# Patient Record
Sex: Female | Born: 1955
Health system: Southern US, Community
[De-identification: ages and names within clinical notes are randomized; demographics above are authoritative.]

## PROBLEM LIST (undated history)

## (undated) DIAGNOSIS — T8859XA Other complications of anesthesia, initial encounter: Secondary | ICD-10-CM

## (undated) DIAGNOSIS — G8929 Other chronic pain: Secondary | ICD-10-CM

## (undated) DIAGNOSIS — Z9889 Other specified postprocedural states: Secondary | ICD-10-CM

## (undated) DIAGNOSIS — M1990 Unspecified osteoarthritis, unspecified site: Secondary | ICD-10-CM

## (undated) DIAGNOSIS — T4145XA Adverse effect of unspecified anesthetic, initial encounter: Secondary | ICD-10-CM

## (undated) DIAGNOSIS — R112 Nausea with vomiting, unspecified: Secondary | ICD-10-CM

## (undated) DIAGNOSIS — I10 Essential (primary) hypertension: Secondary | ICD-10-CM

## (undated) DIAGNOSIS — L4050 Arthropathic psoriasis, unspecified: Secondary | ICD-10-CM

## (undated) HISTORY — PX: BACK SURGERY: SHX140

## (undated) HISTORY — PX: ABDOMINAL HYSTERECTOMY: SHX81

---
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
## 1998-01-19 ENCOUNTER — Encounter: Payer: Self-pay | Admitting: Specialist

## 1998-01-19 ENCOUNTER — Ambulatory Visit (HOSPITAL_COMMUNITY): Admission: RE | Admit: 1998-01-19 | Discharge: 1998-01-19 | Payer: Self-pay | Admitting: Specialist

## 1999-01-18 ENCOUNTER — Other Ambulatory Visit: Admission: RE | Admit: 1999-01-18 | Discharge: 1999-01-18 | Payer: Self-pay | Admitting: Obstetrics and Gynecology

## 1999-08-11 ENCOUNTER — Inpatient Hospital Stay (HOSPITAL_COMMUNITY): Admission: RE | Admit: 1999-08-11 | Discharge: 1999-08-14 | Payer: Self-pay | Admitting: Obstetrics and Gynecology

## 1999-08-11 ENCOUNTER — Encounter (INDEPENDENT_AMBULATORY_CARE_PROVIDER_SITE_OTHER): Payer: Self-pay | Admitting: Specialist

## 2001-01-03 ENCOUNTER — Encounter: Payer: Self-pay | Admitting: Specialist

## 2001-01-09 ENCOUNTER — Inpatient Hospital Stay (HOSPITAL_COMMUNITY): Admission: RE | Admit: 2001-01-09 | Discharge: 2001-01-11 | Payer: Self-pay | Admitting: Specialist

## 2001-01-09 ENCOUNTER — Encounter: Payer: Self-pay | Admitting: Specialist

## 2001-12-31 ENCOUNTER — Other Ambulatory Visit: Admission: RE | Admit: 2001-12-31 | Discharge: 2001-12-31 | Payer: Self-pay | Admitting: Obstetrics and Gynecology

## 2002-01-06 ENCOUNTER — Encounter: Admission: RE | Admit: 2002-01-06 | Discharge: 2002-01-06 | Payer: Self-pay | Admitting: Family Medicine

## 2002-01-06 ENCOUNTER — Encounter: Payer: Self-pay | Admitting: Family Medicine

## 2002-05-27 ENCOUNTER — Encounter: Payer: Self-pay | Admitting: Otolaryngology

## 2002-05-27 ENCOUNTER — Encounter: Admission: RE | Admit: 2002-05-27 | Discharge: 2002-05-27 | Payer: Self-pay | Admitting: Otolaryngology

## 2004-10-11 ENCOUNTER — Other Ambulatory Visit: Admission: RE | Admit: 2004-10-11 | Discharge: 2004-10-11 | Payer: Self-pay | Admitting: Obstetrics and Gynecology

## 2005-01-18 ENCOUNTER — Ambulatory Visit (HOSPITAL_COMMUNITY): Admission: RE | Admit: 2005-01-18 | Discharge: 2005-01-19 | Payer: Self-pay | Admitting: Specialist

## 2005-11-02 ENCOUNTER — Encounter: Admission: RE | Admit: 2005-11-02 | Discharge: 2005-11-02 | Payer: Self-pay | Admitting: Specialist

## 2006-02-05 ENCOUNTER — Inpatient Hospital Stay (HOSPITAL_COMMUNITY): Admission: RE | Admit: 2006-02-05 | Discharge: 2006-02-11 | Payer: Self-pay | Admitting: Specialist

## 2007-11-21 ENCOUNTER — Encounter: Admission: RE | Admit: 2007-11-21 | Discharge: 2007-11-21 | Payer: Self-pay | Admitting: Anesthesiology

## 2008-01-06 ENCOUNTER — Encounter: Admission: RE | Admit: 2008-01-06 | Discharge: 2008-01-06 | Payer: Self-pay | Admitting: Anesthesiology

## 2008-03-16 ENCOUNTER — Ambulatory Visit (HOSPITAL_COMMUNITY): Admission: RE | Admit: 2008-03-16 | Discharge: 2008-03-16 | Payer: Self-pay | Admitting: Orthopaedic Surgery

## 2008-07-08 ENCOUNTER — Ambulatory Visit: Payer: Self-pay | Admitting: Gastroenterology

## 2009-10-07 ENCOUNTER — Encounter: Admission: RE | Admit: 2009-10-07 | Discharge: 2009-10-07 | Payer: Self-pay | Admitting: Anesthesiology

## 2010-08-29 NOTE — Op Note (Signed)
NAMECLARA, HERBISON              ACCOUNT NO.:  [id number]   MEDICAL RECORD NO.:  [id number]          PATIENT TYPE:  AMB   LOCATION:  SDS                          FACILITY:  [facility]   PHYSICIAN:  Vanita Panda. Magnus Ivan, M.D.DATE OF BIRTH:  09/16/1955   DATE OF PROCEDURE:  03/16/2008  DATE OF DISCHARGE:  03/16/2008                               OPERATIVE REPORT   PREOPERATIVE DIAGNOSIS:  Left shoulder partial-thickness rotator cuff  tear and impingement.   POSTOPERATIVE DIAGNOSES:  Left shoulder partial-thickness rotator cuff  tear and impingement.   PROCEDURES:  1. Left shoulder arthroscopy with extensive debridement.  2. Left shoulder arthroscopic subacromial decompression.   SURGEON:  Vanita Panda. Magnus Ivan, MD   ANESTHESIA:  1. Left shoulder regional block.  2. General.   BLOOD LOSS:  Minimal.   COMPLICATIONS:  None.   INDICATIONS:  Briefly, Ms. [name] is a 55 year old female with  debilitating pain involving her left shoulder.  She is on chronic pain  medicine for back, but her shoulder certainly had been flaring up for  some time now.  She had failed conservative nonoperative treatment with  exercise and anti-inflammatories.  The shoulder MRI did show significant  tendinosis of the rotator cuff and the loose partial thickness tearing  and AC joint arthritis.  It was recommended, she undergo arthroscopic  surgery with subacromial decompression, debridement, and possible  rotator cuff repair depending on the extent of the tear.  The risks and  benefits of surgery were explained to her in length and she agrees to  proceed with surgery.   PROCEDURE DESCRIPTION:  After informed consent was obtained and  appropriate left shoulder was marked, anesthesia obtained from regional  shoulder block of the left shoulder.  She was then brought to the  operating room and placed supine on the operating table.  General  anesthesia was then obtained.  She was then fashioned to a  beach chair  position with appropriate positioning of the head and neck.  Appropriate  padding of the nonoperative right arm and positioning of the waist and  knees.  Her left shoulder was then prepped and draped with DuraPrep and  sterile drapes including sterile stockinette and was placed in an in-  line skeletal traction using the fishing pole traction device and 10  pounds of traction.  This was a 4-5 degrees of forward flexion and  neutral rotation.  Time-out was called.  She was identified as the  correct patient, correct left arm.  I then made a posterior incision  just off the posterior lateral edge of the acromion and the glenohumeral  joint was easily encountered.  You could see, there was abundant  synovitis within the shoulder.  The rotator cuff was probed and was  found to have pretty significant articular surface tearing.  This was  not a full-thickness tear.  I then made an anterior portal just lateral  to the coracoid process and a soft tissue ablation wand was inserted and  significant debridement was carried out of the glenohumeral joint.  The  biceps tendon and the labrum were intact posteriorly and anteriorly.  The subscapularis tendon was intact as well.  I did debride undersurface  of the rotator cuff.  I then entered the subacromial space to the  posterior portal.  I made a separate lateral portal, you could see there  was significant bursitis and tendinosis of the rotator cuff and  impingement from bone spurs of the acromion and the distal clavicle.  Arthroscopic soft tissue ablation wand was inserted in this area and  then carried out without significant debridement of the subacromial  space followed by a decompression through the anterior portal using a  high-speed bur to CLO plain underneath the clavicle and the acromion,  and removed the bone spur from the acromion.  I did put the shoulder  through range of motion and I glided appropriately.  I probed the   rotator cuff again and it was found to have its bursal surface fibers  intact.  I then removed all instrumentation and closed the portal sites  with interrupted 3-0 nylon suture.  Xeroform followed by sterile  dressing was applied.  The patient's arm was placed in a sling.  She was  awakened, extubated, and taken to recovery room in stable condition.  All final counts were correct and there were no complications noted.   Of note, postoperatively, I will have her on short-acting pain  medication including oxycodone.  We will have her on some Phenergan for  nausea.  She will continue her other chronic pain medicines as well.      Vanita Panda. Magnus Ivan, M.D.  Electronically Signed     CYB/MEDQ  D:  03/16/2008  T:  03/17/2008  Job:  [id number]   cc:   [facility]

## 2010-09-01 NOTE — H&P (Signed)
[facility]  Patient:    [name], [name] Visit Number: [id number] MRN: [id number]          Service Type: Attending:  Kerrin Champagne, M.D. Dictated by:   Sammuel Cooper. Mahar, P.A. Adm. Date:  01/09/01                           History and Physical  DATE OF BIRTH:  1955-10-25  CHIEF COMPLAINT:  Neck and right arm pain.  HISTORY OF PRESENT ILLNESS:  The patient is a 55 year old female who has been seen in our office for persistent neck pain, aching discomfort into her right arm, and numbness into her right index finger and long finger.  She persists with weakness in the right hand.  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  The pain in her neck and arm have gotten to the point that it is severely affecting her activities of daily living and interfering.  At this point it was felt that the best course of action would be for an anterior cervical diskectomy and fusion.  ALLERGIES:  NOVOCAINE causes HEADACHES.  BIAXIN causes DIARRHEA.  ERYTHROMYCIN also causes GI UPSET.  MEDICATIONS: 1. Allegra 180 q.d. 2. Hydrochlorothiazide 25 mg q.d. 3. Nasacort 1 spray to each nostril b.i.d. 4. Celebrex 200 mg q.d. to b.i.d.  She will stop this three days prior to    surgery. 5. Ultram 50 mg p.r.n. during the day. 6. Darvocet-N 100 p.r.n. q.h.s. 7. Septra DS 800/160 b.i.d. 8. Skelaxin 400 mg p.r.n.  PAST MEDICAL HISTORY: 1. Hypertension, which is medically controlled. 2. Chronic sinus infections.  PAST SURGICAL HISTORY: 1. Lumbar diskectomies in 1973 and 1981. 2. Surgery on deviated septum in 1977. 3. Carpal tunnel release in 1995. 4. Ulnar nerve transposition in 1996. 5. Hysterectomy in 2000.  SOCIAL HISTORY:  She denies tobacco use.  Denies alcohol use.  She is married, with two children, ages 53 and 40, both living at home with her.  FAMILY HISTORY:  Mother is alive at age 29 with a history of  hypertension. Father is alive at age 72 with a history of prostate cancer.  REVIEW OF SYSTEMS:  The patient denies any fever, chills, night sweats, or bleeding tendencies.  CNS:  Denies blurred vision, double vision, seizures, headaches, paralysis.  PULMONARY:  Denies shortness of breath, productive cough, or hemoptysis.  CARDIOVASCULAR:  Denies chest pain, angina, orthopnea, or claudication.  GASTROINTESTINAL:  Denies nausea, vomiting, constipation, diarrhea, melena, or bloody stool.  GENITOURINARY:  Denies dysuria, hematuria, or discharge.  MUSCULOSKELETAL:  As per HPI.  PHYSICAL EXAMINATION:  VITAL SIGNS:  Blood pressure 148/88, respirations 14 and unlabored, pulse 88 and regular.  GENERAL:  [age]-year-old white female who is alert and oriented, in no acute distress.  She is well-nourished, well-groomed.  She is pleasant and cooperative to examination.  HEENT:  Head normocephalic, atraumatic.  Pupils are equal, round, and reactive to light.  Nares patent bilaterally.  Throat is clear.  NECK:  Soft and supple to palpation.  No bruits appreciated.  CHEST:  Clear to auscultation bilaterally.  BREASTS, GENITOURINARY:  No pertinent, not performed.  HEART:  Regular rate and rhythm.  Normal S1, S2.  No murmurs, gallops, or rubs appreciated.  ABDOMEN:  Positive bowel sounds throughout.  Soft and supple to palpation. Nontender, nondistended.  No organomegaly noted.  EXTREMITIES:  Right finger extension weakness, right  triceps weakness, reflex in the right triceps is 1+, on the left is 2+, biceps is 2+ and symmetric bilaterally.  Skin is intact, without any lesions or rashes.  LABORATORY DATA:  MRI reveals a herniated nucleus pulposus of C6-7 and of 5-6.  IMPRESSION: 1. Herniated nucleus pulposus C5-6, 6-7. 2. Hypertension. 3. Chronic sinus infections.  PLAN:  Admit to [facility] on January 09, 2001, to Dr. Vira Browns for an anterior cervical diskectomy and  fusion at C5-6 and 6-7. Dictated by:   Sammuel Cooper. Mahar, P.A. Attending:  Kerrin Champagne, M.D. DD:  01/06/01 TD:  01/06/01 Job: [id number]

## 2010-09-01 NOTE — Op Note (Signed)
[facility]  Patient:    [name], [name] Visit Number: [id number] MRN: [id number]          Service Type: SUR Location: 4W [id number] [id number] Attending Physician:  Lubertha South Proc. Date: 01/09/01 Admit Date:  01/09/2001                             Operative Report  PREOPERATIVE DIAGNOSES:  Herniated nucleus pulposus right C6-7 and herniated nucleus pulposus central C5-6.  POSTOPERATIVE DIAGNOSES:  Herniated nucleus pulposus with herniated nucleus pulposus right and central C6-7 and herniated nucleus pulposus central C5-6.  PROCEDURE:  Anterior cervical diskectomy and fusion C5-6 level, using a 7 mm long dense cancellous allograft bone graft and anterior cervical diskectomy and fusion at the C6-7 level using a 10 mm long dense allograft cancellous bone graft.  Fixation with a 38 mm EBI six-hole plate with 14 mm screws.  SURGEON:  Kerrin Champagne, M.D.  ASSISTANT:  Georges Lynch. Darrelyn Hillock, M.D.  ANESTHESIA:  GOT, Dr. Gladis Riffle.  ESTIMATED BLOOD LOSS:  50 cc.  DRAINS:  TLS x 1, Foley to straight drain.  BRIEF CLINICAL HISTORY:  The patient is a 55 year old female, who has had a history of previous shoulder surgery.  She has had persistent discomfort into her shoulders and pain into her neck.  The patient has undergone attempts at conservative management including the use of physical therapy and traction without relief of pain.  Most recent MR studies have demonstrated disk protrusion at both C5-6 and C6-7.  Some minimal disk changes at the C7-T1 level.  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  Because of persistent weakness and numbness in the right thumb and index finger and moderate disk protrusion at the left side at C5-6 and centrally as well as a large disk protrusion on the right side at C6-7, the patient is brought to the operating room to undergo disk excision and fusion at both C5-6 and C6-7.  INTRAOPERATIVE FINDINGS:  Central disk  protrusion at C5-6 causing compression on the anterior aspect of the thecal sac, central right side disk protrusion causing right C7 nerve root compression and right cord compression.  DESCRIPTION OF PROCEDURE:  After adequate general anesthesia, the patient in the beach chair position, the neck in slight extension with a roll transverse at the level of the shoulder blades.  Five pounds cervical Holter traction, the head in the [place] horseshoe.  All pressure points well padded.  Foley catheter inserted sterilely.  TED stockings to support the legs to prevent DVT.  Standard preoperative antibiotics.  Standard prep over the anterior and left neck using DuraPrep solution, draped in the usual manner, iodine-impregnated Vi-Drape.  The incision anterior left neck, approximately 5-6 cm in length through the skin and subcutaneous layers in in line with the patients skin crease about about 2-2.5 fingerbreadths above the medial border of the clavicle.  Incision carried sharply down through the skin and subcu layers, down to the platysma layer, and this was after infiltration 0.5% Marcaine with 1:200,000 5 cc.  The platysma layer was then incised transverse in line with the skin incision. Several small veins were localized in the superficial layer, and these were preserved.  The interval between the trachea and esophagus and the carotid sheath was then developed bluntly using first Metzenbaum and then finger dissection to the anterior aspect of the cervical spine.  Hand-held Cloward as well as [employer] were used  for retraction purposes.  The medial borders of the longus colli muscle identified and a small amount of cautery performed, and a Academic librarian then used to carefully tease the prevertebral fascia across the midline, freeing up prevertebral fascia anteriorly, identified a small thyroid artery inferiorly.  This was then suture ligated and then a single tie ligation and divided.  Next,  the anterior aspect of the cervical spine extending from C5-6 to C6-7 was carefully freed up and exposed.  The border of the longus colli muscle freed up using key elevator as well as cautery.  Spinal needles were inserted at the expected C5-6 and C6-7 level at lateral C-spine using the patients C-arm.  Fluoroscopy then demonstrated the needles at both C5-6 and C6-7.  Then, under direct observation, the lower needle was removed, a small portion of disk excised anteriorly using a 15 blade scalpel. Again, under direct observation, the upper needle was then removed and the 15 blade scalpel used to excise small portion of disk for continued identification of these levels throughout the remainder of the case.  The medial border of the longus colli muscle freed up bilaterally.  A McCullough retractor was then inserted underneath the medial border of the longus colli muscle, both sides at C6-7 and exposure obtained here.  The 15 blade scalpel was used to incise the anterior annular fibers, and then the pituitary rongeur was used to excise the disk at the C6-7 level first.  Micro curettes as well as pituitary rongeurs were used to debride the disk space of loose disk material that was degenerated.  Cartilaginous endplates were similarly debrided using the micro curettes and high-speed bur.  Posterior aspect of the disk space identified, the operating room microscope then draped and brought into the field.  Up to the entire anterior exposure performance of an anterior diskectomy over the anterior half of the disk was performed using loupe magnification and then the operating room microscope brought into the field for the posterior disk excision.  Under the operating room microscope, posterosuperior lip of C7 was resected using 1 and 2 mm Kerrisons, and then the posterior annulus fibrosis removed from side-to-side. Disk material was noted to extend through a central rent in the posterior annular  fibers, centrally into the subligamentous position and to the right side, and this was resected using micro pituitary rongeurs as well as titanium  nerve hook.  Following this, then the annular fibers were entirely excised on the right side.  Foraminotomy performed over the right C7 nerve root. Following this, the spinal canal was felt to be completely decompressed. Posterior and longitudinal ligament along the right side was resected, and the posteroinferior lip on the right side where C6 was resected using 1 and 2 mm Kerrisons.  C7 nerve root was felt to be exiting freely without any evidence of compression.  The vertebral disk space was then sounded for appropriate graft, and a 10 mm graft was felt to be appropriate.  An 11 mm was available, and this was carefully trimmed to 10 mm thickness.  It was then reactivated in normal saline and then placed over the intervertebral disk space following careful burring of the endplates and irrigation of the intervertebral disk space to ensure there was no further soft tissue or debris within the disk space that could be retropulsed with insertion of graft.  Graft then was inserted and packed into place.  Note, the depth of the intervertebral disk space was measured at 18 mm with the graft  measuring 14 mm depth.  Anterior osteophytes were then carefully burred.  Note that a Caspar distraction system was also used during this portion of the case to maintain distraction across the intervertebral disk space using 14 mm screw post placed above C6 and C7. These were done without difficulty prior to the removal of the posterior aspect of the disk and debridement of endplates began.  Following this, then the distraction system was removed.  Bone wax applied to the bleeding cancellous bone holes.  There was no excess bleeding noted here.  The McCullough retractor was then returned upwards to the C5-6 level.  The medial border of the longus colli muscle  used as a support for the foot of the retractor at this level.  The retractor placed beneath the medial border of longus colli muscle and exposure obtained at the C5-6 level.  With this, then anterior annular fibers were incised using 15 blade scalpel and similar to C6-7, disk was removed using pituitary rongeurs as well as micro curettes.  Another post was placed at the C5 level and distraction obtained across the C5-6 disk space.  The intraoperative microscope used to examine the disk space and observe with the excision of the posterior one-half of the disk ______ of disk material.  The posterosuperior lip of C6 was then carefully resected using 1 and 2 mm Kerrisons.  The right neuroforamen of C5-6 was opened using foraminotomy using 1 and 2 mm Kerrisons.  Finally, the posterior longitudinal ligament was resected along the right side to ensure the right side of the thecal sac and C6 nerve root exited without any evidence of compromise, and this was found to be the case.  A disk protrusion was noted on removal of annulus fibrosis posteriorly.  This was located centrally and towards the right side in the subligamentous position.  High-speed bur was then used to carefully bur the endplates both at the inferior aspect of C6 and superior aspect of C7 over the uncovertebral joint in the region of the right side.  The uncovertebral ______ excised on the right side, decompressing the right C6 nerve root.  With this, then irrigation was performed.  Height of the intervertebral disk space was sounded using an 8 mm and then a 7 mm sounder. The 7 mm long sound appeared to be the best fit.  So a 7 mm graft was obtained and reactivated in normal saline.  Under distraction, inspection of the disk space demonstrated no further debris or soft tissue present that could be retropulsed with insertion of the graft.  The graft was then inserted and packed into place with excellent bone onto cancellous bone  kneeling.  Exposure of the anterior aspect of the cervical spine was obtained, removing the screw posts at the C5-6 level appropriately.  High-speed bur then used to remove soft tissue that was cauterized over the anterior aspect of the vertebral body of C5, C6, and C7, and this was cauterized to allow for its debridement and removal and careful smoothing of osteophytes of the anterior cervical spine for insertion of the cervical plate.  Measurement of the expected length of the plate was performed using a bone wax coated cottonoid string placed across the anterior aspect of the cervical spine extending from mid portion of C5 to the mid portion of C7.  A 38 mm length plate was chosen.  This plate was then carefully contoured to the anterior aspect of the cervical spine, placed against the anterior aspect of the cervical  spine, and carefully placed in such a manner that the screw holes at C6 allowed for purchase at this segment, allowed for bridging of bone grafts at the C6-7 level and the C5-6 level. With then placed, a single stabilizing pin was placed on the left side at C6. The first screw was placed on the right side at C6 using a 14 mm drill and the fixed angle guide.  Following this, a 4.0 screw was then placed 14 mm in depth, locking into the screw plate on the right side.  Stabilizing pin was then removed, and a 5.0 x 14 mm screw was placed at the left side of the 6 level.  At the upper level at C5, then two screws of 4 mm x 14 mm were placed and at the lowest level at C6-7 and the C7 level again, 14 mm screws were used at 4.0 width.  Note that distraction with 5 pound cervical Holter traction was released prior to the placement of the screws at C5 or at C7, both in order to allow for prevention of distraction of the graft.  With this, then each of the washers were found to be mobile around the screw heads, indicating that locking had occurred with each of the screws into the plate.   Irrigation was then performed.  EBI plate was used.  A single 7 mm drain was placed along the left side of the neck anteriorly.  Intraoperative C-arm fluoroscopy demonstrated the plate and screws in good position and lying without evidence of retropulsion of the graft or screws within the spinal canal.  The platysma layer was then reapproximated after careful inspection of incision demonstrated normal-appearing esophagus.  No sign of bleeding.  Platysma layer  approximated with interrupted 3-0 Vicryl sutures, deep subcu layers with interrupted 3-0 Vicryl sutures, and skin then closed with a running subcu stitch of 4-0 Vicryl.  Tincture of Benzoin and Steri-Strips applied, 4 x 4s affixed to the skin with Hypafix tape.  The 7 mm drain was then sewn into place.  The patient had had a dressing applied of 4 x 4s affixed to the skin with Hypafix tape and a Philadelphia collar applied.  The patient was then returned to the recovery room in satisfactory condition.  All instrument and sponge counts were correct. Attending Physician:  Lubertha South DD:  01/09/01 TD:  01/09/01 Job: [id number]

## 2010-09-01 NOTE — H&P (Signed)
[facility]  Patient:    [name], [name] Visit Number: [id number] MRN: [id number]          Service Type: Attending:  Kerrin Champagne, M.D. Dictated by:   Sammuel Cooper. Mahar, P.A.-C.                           History and Physical  ADDENDUM TO JOB #[id number]:  DATE OF BIRTH:  [birth year]  ADDENDUM:  We have received a medical clearance for surgery from the patients medical physician, who is Dr. Arvilla Market from [facility], stating that she is cleared for surgery, the anterior cervical diskectomy and fusion. Dictated by:   Sammuel Cooper. Mahar, P.A.-C. Attending:  Kerrin Champagne, M.D. DD:  01/06/01 TD:  01/06/01 Job: [id number]

## 2010-09-01 NOTE — Op Note (Signed)
[facility]  Patient:    [name], [name]                   MRN: [id number] Proc. Date: 08/11/99 Adm. Date:  [id number] Attending:  Miguel Aschoff                           Operative Report  PREOPERATIVE DIAGNOSES:       1. Chronic pelvic pain secondary to dysmenorrhea.                               2. Pelvic endometriosis.                               3. Urinary stress incontinence.  POSTOPERATIVE DIAGNOSES:      1. Chronic pelvic pain secondary to dysmenorrhea.                               2. Pelvic endometriosis.                               3. Urinary stress incontinence.  PROCEDURE:                    Total abdominal hysterectomy, Marshall-Marchetti procedure.  SURGEON:                      Miguel Aschoff, M.D.  ASSISTANT:                    Luvenia Redden, M.D.  ANESTHESIA:                   General.  COMPLICATIONS:                None.  JUSTIFICATION:                Patient is a 55 year old white female with a long  history of severe dysmenorrhea.  The patient has been treated with narcotic analgesics to control her pain and has now reached the point to where she needs  definitive surgery.  In addition, she has signs and symptoms of urinary stress incontinence with loss of posterior ureterovesical angle and at the time of the  surgery is to have her urethra suspended via Marshall-Marchetti repair.  PROCEDURE:                    The patient was taken to the operating room, placed in the supine position and general anesthesia was administered without difficulty. After this was done she was placed in a modified lithotomy position, prepped and draped in the usual sterile fashion.  A 16 French Foley catheter with 30 cc balloon and three-way channel was inserted into the bladder.  After this was done a Pfannenstiel incision was made, extended down through the subcutaneous tissue with bleeding points being clamped and coagulated as they  were encountered.  The fascia was then identified and incised transversely and then separated from the underlying rectus muscles.  Rectus muscles were divided in the midline.  Peritoneum was then identified and entered carefully avoiding underlying structures.  The peritoneal incision was then extended under direct visualization.  After this  was done inspection revealed the uterus to be globular, no definite implants of endometriosis were noted at this time.  The entered bladder to peritoneum was unremarkable.  The ovaries appeared to be unremarkable.  There was no evidence f endometriosis on the ovaries and no endometriosis was noted in the cul-de-sac.  Intestinal surfaces were unremarkable.  At this point the round ligaments were identified, clamped, cut, and suture ligated using suture ligatures of 0 Vicryl. Then the bladder flap was created anteriorly and taken down across the cervix.  Perforations were then made below the utero-ovarian ligaments.  These ligaments  were then clamped, cut, and suture ligated using suture ligatures of 0 Vicryl and free ties of 0 Vicryl.  The broad ligament was then skeletonized.  The uterine vessels were identified, clamped with curved Haney clamps, and these pedicles were then cut and suture ligated using suture ligatures of 0 Vicryl.  Then in serial  fashion bites of paracervical fascia were taken with straight Haney clamps and these pedicles were clamped, cut, and suture ligated using suture ligatures of  Vicryl and then the uterosacral ligaments and cardinal ligaments were clamped with curved Haney clamps and again all pedicles were cut and suture ligated using suture ligatures of 0 Vicryl.  At this point the cervix was cut free from the vaginal fornices and the specimen free.  The angles of the vaginal cuff were then ligated using figure-of-eight sutures of 0 Vicryl.  In doing this uterosacral ligaments  were incorporated for support.   Then the vaginal cuff was run using running interlocking 0 Vicryl suture and the anterior and posterior walls of the cuff were approximated using two interrupted sutures of 0 Vicryl.  Inspection was made for hemostasis.  Hemostasis appeared to be excellent.  The pelvic floor was reperitonealized using running continuous 2-0 Vicryl suture.  Once this was done attention was directed to the space ______ which was opened and skeletonized. y placing a hand in the vaginal vault and palpating the Foley catheter sutures of 0 Vicryl were placed at the proximal and distal urethra and the paravaginal fascia transfixed to the symphysis pubis.  The sutures were then tied and good elevation of the urethra resulted without over correction.  After this was done lap counts and instrument counts were taken and found to be correct.  The parietal peritoneum was closed using running continuous 0 Vicryl suture.  A Jackson-Pratt drain was  then placed in the space ______ and exited via stab wound on the right and a suprapubic catheter was placed into the bladder exiting in the left lower portion of the incision.  The fascia was then closed using running interlocking 0 Vicryl ______.  Two sutures were used to each lateral fascial angles and meeting in the midline.  Subcutaneous tissue was closed using interrupted 0 Vicryl suture and he skin incision was closed using staples.  The drain and catheter were then transfixed to the skin using a nylon suture.  Pressure dressing was applied. Procedure was completed.  Patient was taken out of the modified lithotomy position and brought to the recovery room in satisfactory condition.  The estimated blood loss was approximately 200 cc.  The patient tolerated the procedure well. DD:  08/13/99 TD:  08/13/99 Job: [id number]

## 2010-09-01 NOTE — Op Note (Signed)
[name], [name]              ACCOUNT NO.:  [id number]   MEDICAL RECORD NO.:  [id number]          PATIENT TYPE:  OIB   LOCATION:  5028                         FACILITY:  [facility]   PHYSICIAN:  Kerrin Champagne, M.D.   DATE OF BIRTH:  30-Apr-1955   DATE OF PROCEDURE:  01/18/2005  DATE OF DISCHARGE:                                 OPERATIVE REPORT   PREOPERATIVE DIAGNOSIS:  Herniated nucleus pulposus, central and right-  sided, lumbar 4-5, with right lumbar radiculopathy.   POSTOPERATIVE DIAGNOSIS:  Herniated nucleus pulposus, central and right-  sided, lumbar 4-5, with right lumbar radiculopathy.   OPERATION PERFORMED:  1.  Right-sided lumbar 4-5 microdiskectomy.  2.  Excision of herniated nucleus pulposus.   SURGEON:  Kerrin Champagne, M.D.   ASSISTANT:  Wende Neighbors, P. A.   ANESTHESIA:  GOT.   ANESTHESIOLOGIST:  Maren Beach, M.D.   ESTIMATED BLOOD LOSS:  The estimated blood loss was 10 mL or less.   SPECIMENS:  None.  Disk material was not sent for pathology.   COMPLICATIONS:  None.   DISPOSITION:  The patient was taken the PACU in good condition.   HISTORY OF THE PRESENT ILLNESS:  The patient, a 55 year old female, with the  acute onset of back pain with radiation into her right leg.  On October 17, 2004 the pain began with bending and stooping.  She underwent attempts at  conservative management including epidural steroid, a walking program and  exercises.  The pain was unable to be relieved with severe pain in the  buttocks at the L5 distribution along the right lateral thigh and into the  foot, right little toe.  She was seen initially by Dr. Charlann Boxer.  MRI scan was  done demonstrating a protruding disk at L4-5, previous disk protrusion at  L5, S1.  This protrusion at L4-5 is on the right side.  The patient  underwent attempts at trying to relieve her pain, which were unsuccessful,  i.e. the use of narcotic medicines and selective nerve root block.  These  only  temporarily relieved her discomfort.  She is experiencing severe pain,  has to lie down with the knees flexed to diminish her discomfort.  She was  brought to the operating room to undergo right-sided L4-5 microdiskectomy.   INTRAOPERATIVE FINDINGS:  The patient was found to have enlargement of her  facet joints along the right side with significant subarticular lateral  recess stenosis in the face of disk protrusion central and rightward causing  right L5 nerve root entrapment within the lateral recess.   DESCRIPTION OF OPERATION:  After adequate general anesthesia with the  patient in the knee chest position on the Andrews frame  standard  preoperative antibiotics were administered as well as standard prep with  DuraPrep solution.  All pressure points were well padded.  The patient was  draped in the usual manner.  Iodine Vi-drape was left off until  intraoperative lateral radiographs could be obtained first with spinal  needles at the expected L4 and L5 levels.  One needle was felt to be at the  L3-4 level and the other just below the L4 spinous process.  Based on this  an incision was made ellipsing the old incision scar based at the L4 and L5  level.  Through the skin and subcutaneous layers down to the lumbodorsal  fascia.  The lumbodorsal fascia was incised on the right side of the spinous  process of L4 and of L5.  A clamp was placed on the spinous process of L4  and intraoperative lateral radiograph demonstrated the clamp at the L4  level.  Cobb was then used to elevate the paralumbar muscles on the right  side off the posterior elements including the spinous process and lamina of  L4 and over the interlaminar region posteriorly at the L4-5 level.  McCullough retractor was then inserted and the posterior aspect of the area  overlying the interlaminar space was nicely exposed.   Leksell rongeur was used to resect a small portion of the inferior aspect of  the lamina on the right  side of L4.  An osteotome was then used to perform  resection in the medial aspect of the intra-articular process of L4 over  about 15-20%.  Kerrison was then used to resect further the inferior aspect  of the lamina on the right side up to the insertion of the ligamentum  flavum.  A 2 mm Kerrison was used to enter the spinal canal over the  superior aspect of the lamina of L5 resecting the medial aspect of the  superior articular process of L5.   The operating room microscope was draped and brought into the field under  direct visualization.  Then the ligamentum flavum on the right side was  resected and the L5 nerve root was identified within the arterial recess.  This was carefully retracted medially and freed with a Penfield 4 and a  D'Errico used to hold the thecal sac at the L5 nerve root over arch epidural  veins which were encountered.  These were cauterized with bipolar  electrocautery.  We continued superiorly and then disk herniation was found  to be present.  This was into the lateral recess on the right affecting  primarily the right L5 nerve root.  After first using bipolar electrocautery  the epidural veins were present.  These were then teased across the midline  with a Penfield 4 and the D'Errico used to continue the retraction of the  thecal sac and nerve root.  The disk was noted to be protruding in the  midline towards the right side.  A 15 blade scalpel was used to incise the  disk longitudinally.  A nerve hook was used to carefully free up the free  fragments beneath the ligament and there was good size disk protrusion piece  was removed.  Next, pituitary rongeurs were then used to resected a  superficial portion of the posterolateral aspect of the disk on the right  side removing any loose fragments from within the intervertebral disk space.  Epstein curet was used to further free up the disk material and its attachments, resecting further using upbiting and  downbiting pituitaries as  well as straight pituitaries until no further fragments were found to be  present.   Irrigation was then performed.  The hockey stick and neural probe was passed  out the neural foramen of L5 and of L4 demonstrating real patency here  anteriorly as well as posteriorly to the nerve root at each segment.  With  this done it was felt that the  spinal canal had been well decompressed.  A  disk herniation had been removed.  Bone wax was applied to the bleeding  cancellous bone surfaces along the medial aspect of the joint at the L4-5  level and along the inferior aspect of the lamina here.  Excess bone wax was  removed.  Irrigation was again performed.  Thrombin soaked Gelfoam was used  to obtain hemostasis.  This was then removed at the end of the case.  There  was no active bleeding present.  The soft tissues were allowed to fall back  into place.  The lumbodorsal fascia was then approximated in the midline  with interrupted sutures of 0-Vicryl sutures using a UR-6 needle.  Deep  subcu layers were approximated with interrupted 0-Vicryl sutures, the more  superficial layers with interrupted 2-0 Vicryl sutures and the skin closed  with a running subcuticular stitch of 4-0 Vicryl.  Tincture of benzoin and  Steri-strips were applied.  Four-by-fours were affixed to the skin with  Hypafix tape.   The patient was then returned to the supine position, reactivated, extubated  and taken to the recovery room in satisfactory condition.      Kerrin Champagne, M.D.  Electronically Signed     JEN/MEDQ  D:  01/18/2005  T:  01/19/2005  Job:  [id number]

## 2010-09-01 NOTE — Discharge Summary (Signed)
[facility]  Patient:    [name], [name]                   MRN: [id number] Adm. Date:  [id number] Disc. Date: [id number] Attending:  Miguel Aschoff                           Discharge Summary  ADMISSION DIAGNOSES: 1. Dysmenorrhea. 2. Pelvic pain. 3. Urinary stress incontinence.  FINAL DIAGNOSES: 1. Dysmenorrhea. 2. Pelvic pain. 3. Urinary stress incontinence.  OPERATIONS AND PROCEDURES:  Total abdominal hysterectomy, Marshall-Marchetti procedure.  BRIEF HISTORY:  This patient is a 55 year old white female with history of severe dysmenorrhea and pelvic pain, progressive in nature, which required the use of narcotic analgesics to control the symptoms during her menses.  The pain became  such an inconvenience for the patient that she now requested a definitive procedure be carried out to correct the severe dysmenorrhea; in addition, the patient has  urinary stress incontinence with loss of the posterior urethrovesical angle and  also requested that this be corrected at time of her hysterectomy; she was therefore admitted to the hospital to undergo total abdominal hysterectomy and Marshall-Marchetti procedure to both correct the pelvic pain, dysmenorrhea and urinary stress incontinence.  LABORATORY DATA:  Admission labs were obtained with a hemoglobin of 13.2, hematocrit 36.7, WBC 8700.  PT and PTT were within normal limits.  Urinalysis was essentially within normal limits except for 1 mg/dl of urobilinogen.  Chemistry  profile was within normal limits.  HOSPITAL COURSE:  Under general anesthesia on August 11, 1999, the patient underwent total abdominal hysterectomy and Marshall-Marchetti repair of her urethra without difficulty.  Patient tolerated the operative procedure well; had a suprapubic catheter placed.  Patients postoperative course was essentially unremarkable. he remained afebrile, she tolerated increasing ambulation and diet well  and by August 14, 1999, was in satisfactory condition and stable enough to be sent home; however, she was still unable to void spontaneously.  Patient was sent home with her suprapubic catheter in place, instructed on its use and she was instructed o keep the catheter clamped until she had the sensation to avoid and to try to empty her bladder spontaneously and if this was not possible, to open up the catheter and drain it.  Patient will be contacted on May 3rd to see if she is able to void spontaneously.  DISCHARGE MEDICATIONS:  Medications for home include Tylox one every three hours as needed for pain; Urispas 100 mg q.i.d. as needed for bladder spasm.  DIET:  She is sent home on a regular diet in satisfactory condition.  DISCHARGE INSTRUCTIONS:  She was instructed to place nothing in the vagina and o call if there are any problems such as fever, pain or heavy bleeding.  When the  patient is able to void, she will be brought back to the office to have her suprapubic catheter removed.  CONDITION ON DISCHARGE:  Satisfactory. DD:  08/14/99 TD:  08/17/99 Job: [id number]

## 2010-09-01 NOTE — Discharge Summary (Signed)
[name], [name]              ACCOUNT NO.:  [id number]   MEDICAL RECORD NO.:  [id number]          PATIENT TYPE:  INP   LOCATION:  5036                         FACILITY:  [facility]   PHYSICIAN:  Kerrin Champagne, M.D.   DATE OF BIRTH:  05/25/1955   DATE OF ADMISSION:  02/05/2006  DATE OF DISCHARGE:  02/11/2006                               DISCHARGE SUMMARY   ADMISSION DIAGNOSES:  1. Severe lumbar degenerative disk disease L4-L5 and L5-S1.  2. Migraine headaches.  3. Hypertension.   DISCHARGE DIAGNOSES:  1. Severe lumbar degenerative disk disease L4-L5 and L5-S1.  2. Migraine headaches.  3. Hypertension.  4. Postoperative fever felt secondary to blood transfusion reaction.  5. Urinary tract infection.   PROCEDURES:  On February 05, 2006 the patient underwent right-sided L4-L5  and right L5-S1 transforaminal lumbar interbody fusion performed by Dr.  [name] and assisted by [name] under general anesthesia.   CONSULTATIONS:  Dr. Rito Ehrlich for internal medicine.   BRIEF HISTORY:  The patient is a 55 year old female status post previous  lumbar decompression in 1997 and microdiskectomy right L4-L5 in 2006.  She has had persistent low back pain with radiation into her right leg.  She has required chronic narcotic analgesics to control her pain. Lumbar  disk gram demonstrated replication of her pain with injection of both L4-  L5 and L5-S1 levels with apparent signs of disk tears of both segments.  It was felt that she would require surgical intervention and was  admitted for the procedure as stated above.   BRIEF HOSPITAL COURSE:  The patient tolerated the procedure under  general anesthesia without complications.  Postoperatively her  hemoglobin dropped to 8.8 with hematocrit 25.1. She did have 2 units of  autogenous blood available, and the first unit was initiated  postoperatively on the first postoperative day. Unfortunately she spiked  a fever to 103.7. At that time  blood cultures were taken. The  transfusion was stopped, and she did receive one dose of IV Rocephin.  During that time she did continue to use p.o. analgesics in the form of  Percocet. She had also received PCA analgesics. On the second  postoperative day she was able to start a regular diet and was having  flatus. Her Foley catheter was discontinued, but unfortunately she had  urinary retention and it required replacement of the Foley catheter. She  also was given Lasix 20 mg and diuresed 2 liter output. Dr. Rito Ehrlich was  called for consultation secondary to the fever as well as possible  volume overload. He did feel the patient was stable when he saw her as  lung sounds were clear and chest x-ray as well as urinalysis were within  normal limits. Eventually the blood cultures also proved to be without  any growth. She had a normal CBC with no change in her differential as  well. Eventually the patient was more stable. She had a small amount of  drainage from her wound throughout much of the hospital stay. Discharge  was only a scant amount of drainage. She was able to ambulate with  physical therapy. Neurovascular motor function of the lower extremities  was noted to be intact. On February 11, 2006 she was afebrile and vital  signs were stable. She continued to have some nocturnal fevers and was  advised to monitor these closely at home. She was discharged home in  stable condition.   PERTINENT LABORATORY VALUES:  CBC as stated above within normal limits.  On admission hemoglobin 12.4, hematocrit 35.7 on admission. Lowest value  of hemoglobin was 8.8, but at discharge hemoglobin was 9.6 and stable;  normal white blood cell count through the hospital stay. Chemistry  studies on admission within normal limits. Repeat on October 24 also  normal with exception of glucose 110. Calcium 8.2.  Urinalysis on  October 25 as well as October 28 were without findings of urinary tract  infection.  Blood cultures on October 24 showed no growth with final on  October 31. Urinalysis taken on October 28 showed final reading on  November 1 of greater than 100,000 colonies of staph. As the patient had  been discharged from the hospital at the time of this culture report, it  was sent to the office and appropriate measures were made from our  office for the patient's treatment. She did receive 1 unit of autologous  blood during the hospital stay. At discharge she was ambulating 200 feet  with rolling walker and was able to go up and down three steps.   PLAN:  Arrangements were made for suitable durable medical equipment  including a walker. She was advised to call if she had temperature  greater than 101, if her pain was not relieved with medication, or if  she had uncontrolled bleeding or drainage from her wound. Medications  given at discharge include Percocet 10/325 one every 4-6 hours as needed  for pain, Skelaxin 400 mg one every 6-8 hours as needed for spasm. She  was instructed to keep her dressing changed daily and to keep the  incision dry as long as there was drainage. Once the drainage is  subsided, she will be allowed to shower. She will avoid bending,  lifting, twisting, and no driving. The patient was instructed to follow  up with Dr. [name] two weeks from the date of surgery and was given a  number to call for the appointment. She will continue on a regular diet.  All questions encouraged and answered prior to discharge.      [name], P.A.      Kerrin Champagne, M.D.  Electronically Signed    SMV/MEDQ  D:  04/02/2006  T:  04/02/2006  Job:  [id number]

## 2010-09-01 NOTE — Op Note (Signed)
NAMEJANEL, [name]              ACCOUNT NO.:  [id number]   MEDICAL RECORD NO.:  [id number]          PATIENT TYPE:  INP   LOCATION:  5036                         FACILITY:  [facility]   PHYSICIAN:  Kerrin Champagne, M.D.   DATE OF BIRTH:  [birth year]   DATE OF PROCEDURE:  02/05/2006  DATE OF DISCHARGE:                                 OPERATIVE REPORT   PREOPERATIVE DIAGNOSIS:  Severe lumbar degenerative disk disease, L4-5, L5-  S1.   POSTOPERATIVE DIAGNOSIS:  Severe lumbar degenerative disk disease, L4-5, L5-  S1.   PROCEDURE:  Right-sided L4-5 and right L5-S1 transforaminal lumbar interbody  fusion using DePuy leopard cages, 8 mm and 7 mm, respectively, with local  bone graft.  Posterolateral fusion L4-S1 with Symphony and local bone graft,  pedicle screw and rod instrumentation using DePuy Monarch pedicle screw  system.   SURGEON:  Kerrin Champagne, M.D.   ASSISTANT:  Wende Neighbors, P.A.   ANESTHESIA:  General via orotracheal intubation, Dr. Gypsy Balsam.   SPECIMENS:  None.   DRAINS:  Placed Hemovac x1, Foley to straight drain.   ESTIMATED BLOOD LOSS:  350 mL.   COMPLICATIONS:  None.  The patient returned to the PACU in good condition.   Soft tissue resistance testing indicated values of 25 on the left and 18 on  the right at the L4 level, 30 on the left and 25 on the right at the L5  level, 30 on the left and 30+ on the right at the S1 level.   INTRAOPERATIVE FINDINGS:  Severe degenerative disk disease, both segments,  with very low residual nucleus pulposus remaining.  Bone on bone appearance  at the L5-S1 level.  Only able to place the cage along the right side of the  disk space, unable to cross the midline due to bony shelf here and bony  hypertrophy.   HISTORY OF PRESENT ILLNESS:  The patient is a 55 year old female who has  undergone previous lumbar decompression and surgery in 1997 and  microdiskectomy on the right side at L4-5 in 2006.  She has had persistent  severe  back pain and radiation to her right leg.  She has had persistent use  of medications, including Vicodin and Ultram, to control her pain.  The pain  has persisted.  It is worsening.  It is present with mechanical flexion.  She underwent lumbar diskogram which demonstrated replication of her pain  with injection of both the L4-5 and L5-S1 levels with apparent signs of disk  tears of both segments.  She is brought to the operating room to undergo a  lumbar fusion at L4-5 and L5-S1 for degenerative disk disease.   DESCRIPTION OF PROCEDURE:  After adequate general anesthesia, the patient  transferred to the [facility] fracture table.  She had a Foley catheter placed,  standard preoperative antibiotics.  She was placed in the prone position,  chest rolls, pillows placed under the legs along with TED hose and PAS  stockings.  Intraoperative neural monitoring with electrodes placed on the  lower extremities, arms and over the head and scalp.  A  standard prep with  DuraPrep solution from the lower dorsal spine to the mid-sacral level.  Draped in the usual manner.  Iodine Vi-Drape was used.  The old incision  scar was marked out.  It was ellipsed after infiltration with Marcaine 0.5%  with 1:200,000 epinephrine.  The incision through skin and subcutaneous  layers was carried down to the lumbodorsal fascia, extending from  approximately L2 to S2.  I then incised on both sides of the spinous process  extending from L2 to S2.  A Cobb was used to elevate the paralumbar muscles  on both sides from L2 down to S2.  Electrocautery used to control bleeders.   Initial cerebellar retractor was placed.  Facets preserved at the L2-3 and  L3-4 levels and resected at L4-5 and L5-S1 levels bilaterally.  Intraoperative C-arm used to demonstrate clamps placed on the spinous  process of L4 and of L3.  The L4 level marked for continued identification,  as well as the next lowest level or caudal segment, the L5 spinous  process.  With this then, the exposure was carried out lateral to the sacral ala  bilaterally at the S1 level, the transverse processes L5 and transverse  processes L4.  Carried superiorly to allow for lateral exposure and  placement of the pedicle screws.  Soft tissue was resected off of the  transverse process of L4 and L5, both sides, and the sacral ala, both sides.  Osteotome was used to perform a resection of the inferior articular process  of L4 and of L5 on the right side and then resecting bone within the region  of the pars in order to perform a total facetectomy on the right side.  Ligamentum flavum was resected within the right side lateral recess and  extending out into the neural foramen along with reflected portions.  Superior articular process of L5 and of S1 were resected as well down to the  superior portion of the pedicle at each segment.  The L4 and L5 nerve roots  well decompressed on the right side.  Thecal sac carefully evaluated,  retracted medially and disk entered at the L4-5 level after monopolar  electrocautery used to control small epidural vein bleeders.  After sizing  the disk with a 15-blade scalpel, pituitary was used to excise disk material  on the right side at L4-5.   At the L5-S1 level, similarly the lateral recess was decompressed.  The S1  nerve root foraminotomy performed allowing for the nerve root to be well  decompressed.  Bleeders within the lateral recess of the superior aspect of  the pedicle of S1 were then cauterized using bipolar electrocautery.  Thecal  sac and L5 nerve root were mobilized and the disk then incised using a 15-  blade scalpel and pituitary used to excise the disk.  Gelfoam, thrombin-  soaked placed, monopolar electrocautery used to control small bleeders.   Attention then turned to placement of pedicle screws first on the left side. Beginning at the L4 level, an awl was used to make an entry point into the  lateral aspect  of the pedicle at the L4 segment on the left side.  This was  along the lateral aspect of the pedicle and at the intersection of the  pedicle with the transverse process.  Next, a straight pedicle finder was  then used to probe the pedicle, and this was done on the left side first,  probing the pedicle channel into the vertebral body, a length  of 40 mm.  After this was completed a ball-tip probe was used to probe the channel,  indicating no evidence of broaching the cortex.  Tapping was then performed  using a 5.5 tap and a 6.25 screw was placed, 6.25 x 40 mm.  Note the  decortication of the transverse process was carried out and Symphony bone  graft was then placed prior to placement of the screw at the L4 level on the  left side.   Next, attention turned to the L5 on the left side.  Here an awl also used to  make an initial entry point over the lateral aspect of the pedicle at L5.  Intersection of the transverse process with the superior articular process  of L5.  A handheld straight pedicle finder then used to probe the pedicle.  This was then carefully evaluated with the ball-tip probe and felt to be  patent.  This was then tapped with a 6.25 tap and a 7 x 40-mm screw was  placed at this segment after first decorticating the transverse process of  L5 and placing Symphony bone graft into the posterolateral region.   Attention was then turned to the S1 level on the left side and here an awl  was used to make an entry point just lateral to the mid-portion of the  superior articular process of S1 at its facet level.  It was noted on C-arm  fluoroscopy to be in adequate position and alignment.  A pedicle finder was  then used to probe the pedicle to a depth of 35 mm.  A 7 x 35-mm screw was  chosen.  Decortication of the ala carried out and additional Symphony bone  graft placed prior to placement of the screw at this level and the left S1  level.   Then, after changing sides, the  right-sided pedicle screws were placed,  first at the L4 segment, repeating the previous steps, using an awl for an  entry point on the lateral aspect of the pedicle at L4 and then using a  pedicle probe, straight probe to pedicle at this level.  A 40-mm screw was  chosen.  Note that tapping was performed using a 5.5 tap and a 40 x 6.25  screw was placed on the right side at the L4 level.  The decortication of  the transverse process and Symphony bone graft placed as before.  Then at  the L5 level on the right side again an awl used to make an entry point.  This pedicle was more easily seen.  After making the initial entry point  over the lateral aspect of the pedicle, then a pedicle probe was used to  probe the pedicle to 40 mm.  Felt to be too long on the lateral radiographs,  so a 35-mm screw was chosen to tap with a 6.25 tap and a 7 x 35-mm screw was  placed on the right side at the L5 level.  Attention then turned to the S1 level on the right side.  Here, an initial  entry point was made lateral to the facet.  It was felt to be too lateral  and was likely penetrating the anterior cortex of the sacral ala, carefully  exposing the S1 pedicle then and identifying it within the spinal canal.  An  awl was then used to make an entry point at the mid-portion of the facet on  the right side of the superior articular process of S1.  Then a  pedicle  probe was able to probe the S1 pedicle on the right side to 35 mm.  This was  then tapped with a 6.25 tap.  Decortication of the ala carried out.  Bone  graft placed out laterally.  Then a 35 x 7 screw was placed at the S1 level  on the right side obtaining excellent purchase.   Next, each of the pedicle screws were then tested for soft tissue resistance  on the right side.  The lowest level was obtained at the L4 pedicle screw.  This was carefully evaluated with AP radiograph and lateral.  It  demonstrated excellent position and alignment.  It was  felt the screw likely  had penetrated cortex lateral to the pedicle position as seen on oblique.  Expansion of the L4 nerve root did not demonstrate any nerve compression  here.  Continuing the evaluation then, each of the remaining five screws  were greater than 25 in their value of soft tissue resistance and felt to be  adequate.   With this done, attention turned to performing the TLIF on the right side,  first at the L5 to S1 level.  The disk incised.  The thecal sac and the L5  nerve root were retracted with D'Errico.  Pituitary rongeur used to debride  the disk space.  This then dilated to 8 mm.  A 7-mm cage was chosen as the 8-  mm provided a very tight fit and dilatation could not be performed any  larger.  With debridement of the disk, large curets could not be entered  into the disk space and the disk was debrided using ring curets.  Additionally, we could not cross the midline as there was a bony bridge  present here that could not be bridged.  Then decortication was carried out  over the right side of the disk space at L5-S1 down to bleeding endplates.  Trial with a 7-mm trial was carried out.  This provided excellent fit.  A  lordotic 7-mm cage was chosen.  This was packed with local bone graft.  Additional bone graft was placed within the intervertebral disk space and  then the 7-mm cage was impacted into place and rotated into a more  transverse position.  I was unable to fully seat the cage across the midline  due to a bony bridge present and bony prominence here, so I accepted it on  the right side in a lateral position, recognizing and checking the L5 nerve  root to ensure there was no impingement present.   This completed, then attention turned to the L4-5 level where similarly the  disk was excised using pituitary rongeurs.  The disk space then debrided of  disk material and cartilaginous endplates using curettage as well as regular curets, upbiting the right,  upbiting the left and ring curets.  The disk  space then debrided down to bleeding bone endplate surfaces.  Dilatation was  carried out to 8 then a 9-mm dilation.  Initially 9 was to be used.  Then it  was decided to use an 8-mm cage as the 8 trial provided such an excellent  fit.  With this completed then, the disk space well debrided.  Local bone  graft was then placed within the intervertebral disk space and this was  impacted into place with a 7-mm trial.  An 8-mm lordotic cage, DePuy leopard  type, was then packed with local bone graft.  This was then impacted into  place and  brought in to a transverse position, impacted across the midline  without difficulty.  Irrigation was then performed.  Rods, 65 mm, were  chosen.  These were placed within the pedicle screw fasteners on each side  and caps placed without difficulty.  The L4 fastener was then tightened to  the rod, 80 foot-pounds.  Compression between the fastener at L4 and L5  obtained using the compressor provided and then the L5 fastener tightened to  80 foot-pounds.  Then the left side was similarly compressed between L4 and  L5 and the L5 fastener tightened to the rod at 80 foot-pounds.   Returning to the right side then, compression between the L5 and the S1  pedicle screw fasteners and then the S1 fastener on the right side was  tightened to the rod at 80 foot-pounds.  Similarly this was done on the left  side between L5 and S1, tightening the fastener, after compressing between  the fasteners using compressor, tightening to 80 foot-pounds.  This  completed the instrumentation portion of the case.  Intraoperative C-arm  fluoroscopy demonstrated the cages to be in good position and alignment as  well as the pedicle screws and rods, good position and alignment in both AP  and lateral planes.  Obliquing the pedicle screws on the right side  carefully, the screw at the L4 level felt to be perhaps slightly lateral but  in  adequate position.  With this done, irrigation was carried out.  There  was no active bleeding present.  A Cell Saver was used during the case.  Patient had 350 mL of blood loss and no attempt was made to return Cell  Saver blood as there was too little present to be spun down.  Gelfoam,  thrombin-soaked, was then placed over the right-sided areas of the  facetectomies.  Platelet-poor plasma then placed over the wounds.  Soft  tissues were allowed to fall back into place.  Bleeders were controlled  using bipolar electrocautery.  A medium-sized Hemovac drain placed in the  depth of the incision, exiting over the right lower lumbar spine.  The  lumbodorsal fascia then approximated to the spinous processes and  interspinous ligaments with interrupted, #1 Vicryl sutures.  The deep subcu  layer was approximated with interrupted, #1 Vicryl suture, the more  superficial layers with interrupted 2-0 Vicryl suture and the skin closed with a running subcu stitch of 4-0 Vicryl.  A tincture of benzoin and Steri-  Strips applied, 4 x 4's and ABD pad affixed to the skin with Hypafix tape.  All instrument and sponge counts were correct.  The patient was then  returned to the supine position, reactivated, extubated and returned to the  recovery room in satisfactory condition.      Kerrin Champagne, M.D.  Electronically Signed     JEN/MEDQ  D:  02/05/2006  T:  02/06/2006  Job:  [id number]

## 2010-09-01 NOTE — H&P (Signed)
[facility]  Patient:    [name], [name]                   MRN: [id number] Adm. Date:  [id number] Disc. Date: [id number] Attending:  Miguel Aschoff                         History and Physical  ADMISSION DIAGNOSES:  Chronic pelvic pain, dysmenorrhea, urinary stress incontinence.  BRIEF HISTORY:  The patient is a 55 year old white female, para 2-0-0-2 with a ong history of severe dysmenorrhea requiring narcotic analgesics.  The patient has een requiring as a minimum Tylenol No. 3 for her menstrual cramps and this has been  supplemented with Toradol and most recently the patient has required Tylox because of the worsening menstrual cramps.  The patient was seen in the office on multiple occasions because of this complaint and was given multiple options including laparoscopy with laser ablation of the uterosacral nerves, Lupron therapy, Depo-Provera therapy.  We will review the patients current symptomatology as well as symptoms of urinary stress incontinence.  She requests that definitive procedure be carried out to ______ alleviate the severe dysmenorrhea and also correct the  urinary stress incontinence.  She is therefore being admitted to [facility] at this time to undergo definitive therapy, the total abdominal hysterectomy and Marshall-Marchetti repair.  The patient has also had a history of irregular menstrual cycles, which in October of 2000 was treated with cyclic Prometrium 200 mg daily, day 1-12 of each month.  This did regulate the cycles ut did not improve the dysmenorrhea.  She denies any history of nausea, vomiting, diarrhea, melena, or hematochezia.  She denies dysuria, frequency or urgency. Again, she does have symptoms of loss of urine with straining, coughing and sneezing.  PAST MEDICAL HISTORY:  Positive for gastroesophageal reflux.  The patient has also had a long history of cervical and lumbar disk disease.  PAST  SURGICAL HISTORY:  Positive for previous laminectomy, cesarean section, tubal sterilization, Nissen fundoplication.  CURRENT MEDICATIONS: 1. Celebrex 200 mg daily. 2. Tylox one q.3-4h. as needed for severe dysmenorrhea. 3. Anaprox DS as needed for heavy menstrual flow.  ALLERGIES:  Positive for allergy to NOVACAIN.  The patient reports development f headache with this medication.  FAMILY HISTORY:  Noncontributory.  REVIEW OF SYSTEMS:  Head, eyes, ears, nose, and throat is negative. CARDIORESPIRATORY:  Negative for shortness of breath, cough or hemoptysis. CARDIAC:  Negative.  GASTROINTESTINAL:  See present illness.  GENITOURINARY: See present illness.  GYNECOLOGIC:  See present illness.  The patients most recent ap smear of January 18, 1999, was satisfactory and within normal limits.  ______ the patient does report pain in her neck with radiation into her arms ______ with paresthesias.  PHYSICAL EXAMINATION:  GENERAL:  The patient is a well-developed, well-nourished white female in no acute distress.  VITAL SIGNS:  Temperature 98.2, pulse 91, respirations 18, blood pressure 140/100. Height 5 feet 5 inches, weight 218 pounds.  HEENT:  Head is normocephalic, atraumatic.  Eyes shows pupils to be equal and react to light and accommodation.  Sclerae white.  Conjunctivae are pink. Extraocular muscles full range of motion.  Nose is patent without gross lesion, it is moist and midline.  Throat is clear.  NECK:  Supple.  There is no adenopathy or jugular venous distension noted.  No thyromegaly is noted.  BREASTS:  Nontender.  No masses are noted.  There is some thickening  in the upper outer quadrants of both breasts consist with mild fibrocystic change.  ABDOMEN:  Soft and nontender.  There is a well-healed Pfannenstiel incision in he lower abdomen.  There is no evidence of enlargement of the liver, kidneys or spleen. Bowel sounds are active.  There is no rebound or  guarding.  No masses are noted.  BACK:  Reveals no CVA tenderness or deformity.  PELVIC:  Examination reveals normal external genitalia.  Normal Bartholins, urethral, and Skenes glands.  Normal urethra.  Vaginal vault is without gross lesion.  Cervix is without gross lesion.  Uterus ______ , approximately 6-8 weeks in size and retroflexed.  No adnexal masses are noted.  Rectovaginal examination is confirmatory.  EXTREMITIES:  Reveal no clubbing, cyanosis, or edema.  SKIN:  Without gross lesion.  IMPRESSION:  Dysmenorrhea, pelvic pain, menorrhagia, urinary stress incontinence.  PLAN:  Plan is for the patient to undergo definitive therapy.  The total abdominal hysterectomy and Marshall-Marchetti repair for urinary stress incontinence. Again the risks and benefits of this undertaking have been discussed with the patient and all questions answered. DD:  08/14/99 TD:  08/15/99 Job: [id number]

## 2010-09-01 NOTE — Consult Note (Signed)
[name], [name]              ACCOUNT NO.:  [id number]   MEDICAL RECORD NO.:  [id number]           PATIENT TYPE:   LOCATION:                               FACILITY:  [facility]   PHYSICIAN:  Hollice Espy, M.D.DATE OF BIRTH:  Aug 11, 1955   DATE OF CONSULTATION:  02/07/2006  DATE OF DISCHARGE:                                   CONSULTATION   REFERRING PHYSICIAN:  Kerrin Champagne, M.D.   REASON FOR CONSULTATION:  Fever.   HISTORY OF PRESENT ILLNESS:  The patient is a 55 year old white female with  past medical history of hypertension, obesity, and chronic lower back pain  secondary to DJD, who was admitted on February 04, 2006 and underwent a right-  sided L4-S1 transforaminal lumbar fusion.  She did well postoperatively and  had some blood loss requiring a unit transfused of her own blood, but  otherwise had no complications initially.  She did, however, then note to  have a temperature overnight on the night of October 24 of 103.7.  Dr. [name]  followed and ordered a chest x-ray which showed some evidence of some  thickening.  He was concerned about volume overload and gave her a low dose  of Lasix of which she put out 1.5 L.  Since that time he has continued to  follow.  He ordered blood cultures, gave her a dose of Rocephin, and has  ordered a UA which has been unremarkable.  The patient herself is currently  feeling okay.  She complains of some chronic back pain although has been out  of bed.  She denies any headaches, vision changes, dysphagia, chest pain,  palpitations, shortness of breath, wheezing, coughing.  No abdominal pain,  yet she says she has had a lot of bowel noise as well as passed gas,  although she has not had a bowel movement, although her diet has only  recently been upgraded to solid food today.  She denies any leg pain or  cramping.  Her main complaint is of her pain in her low back.   REVIEW OF SYSTEMS:  Otherwise negative.   PAST MEDICAL HISTORY:  1.  Obesity.  2. Migraines.  3. Hypertension.   MEDICATIONS:  The patient currently while in the hospital is on atenolol,  Rocephin, Colace, Prinivil, Reglan, morphine, OxyContin, Protonix, MiraLax,  K-Dur, Zocor, D5 normal saline at 20 cc an hour and then p.r.n. Benadryl,  ibuprofen, Claritin, Skelaxin, and Nasonex.  She is also on a PCA pump.   ALLERGIES:  1. The patient is allergic to ROBAXIN.  2. ERYTHROMYCIN.  3. NOVOCAIN.   SOCIAL HISTORY:  She denies any tobacco, alcohol, or drug use.   FAMILY HISTORY:  Noncontributory.   PHYSICAL EXAMINATION:  VITAL SIGNS:  Temperature is currently 97, T-max of  103.7 yesterday evening.  Her last elevated temperature was last night as  well.  Pulse 83, blood pressure 100/43, respirations 18, O2 saturation 95%  on 2 L.  GENERAL:  In general the patient is alert and oriented x3.  No apparent  distress.  HEENT:  Normocephalic, atraumatic.  Her  mucous membranes are dry.  NECK:  She has no carotid bruits.  HEART:  Regular rate and rhythm.  S1/S2.  LUNGS:  Clear to auscultation bilaterally.  ABDOMEN:  Soft, obese, nontender.  Hyperactive bowel sounds.  EXTREMITIES:  Show no clubbing, cyanosis, or edema.  I did not examine her  leg wound but referred to Dr. [name], who discussed with me and told me that  the wound looks good with no drainage.   LABORATORY DATA:  Urinalysis is normal.  BMET:  Sodium 130, potassium 3.4,  chloride 102, bicarb 30, BUN 5, creatinine 0.9, glucose 110.  H&H is 10 and  28.2.  The last CBC was done on October 22, which showed a white count of  9.8, H&H 12.4/35.7, platelet count of 306.   Chest x-ray:  Report done from October 24 reads mild peribronchial  thickening of focal opacities.   ASSESSMENT/PLAN:  1. Fever in a postoperative spinal fusion patient.  In regards to the      patient's fever there is no clear determinate source and this may      simply be a postoperative fever secondary to her surgery.  Her lung       sounds appear to be clear as well as confirmed by chest x-ray, as is      her urinalysis.  I would favor monitoring her and would agree with Dr.      Barbaraann Faster plan to repeat her chest x-ray in the morning, as well as I      will at the time order a CBC with differential.  If her differential is      elevated, I would then follow and continue her on the IV Rocephin.  If      it is negative I would actually discontinue the Rocephin as it may not      show any further plans.  I do not think that she has a UTI as well.  If      she spikes another temperature but has a normal white count, other      sources of infection may be possible __________ spinal surgery, but      this I doubt as well.  Or finally, this could be a DVT but again less      likely so.  2. Obesity.  Continue to monitor.  3. Hypertension.  This has been slightly hypotensive, likely secondary to      use of her pain medication.  The plan is to eventually taper this off      and the fact that she is having pain, would continue as directed.  4. History of migraines, which is stable.      Hollice Espy, M.D.  Electronically Signed     SKK/MEDQ  D:  02/07/2006  T:  02/07/2006  Job:  [id number]   cc:   Kerrin Champagne, M.D.

## 2011-01-16 LAB — CBC
HCT: 39.3
Hemoglobin: 13.6
MCHC: 34.6
MCV: 91.8
Platelets: 229
RBC: 4.28
RDW: 13.2
WBC: 7.3

## 2011-01-16 LAB — BASIC METABOLIC PANEL
BUN: 15
CO2: 27
Calcium: 9.2
Chloride: 103
Creatinine, Ser: 0.95
GFR calc Af Amer: >60
GFR calc non Af Amer: >60
Glucose, Bld: 88
Potassium: 4.3
Sodium: 138

## 2013-02-06 ENCOUNTER — Ambulatory Visit: Payer: Self-pay | Admitting: Podiatrist

## 2014-05-25 ENCOUNTER — Ambulatory Visit
Admission: RE | Admit: 2014-05-25 | Discharge: 2014-05-25 | Disposition: A | Discharge status: Home / Self Care | Payer: 59 | Source: Ambulatory Visit | Attending: Physician Assistant | Admitting: Physician Assistant

## 2014-05-25 ENCOUNTER — Other Ambulatory Visit: Payer: Self-pay | Admitting: Physician Assistant

## 2014-05-25 DIAGNOSIS — R05 Cough: Secondary | ICD-10-CM

## 2014-05-25 DIAGNOSIS — R52 Pain, unspecified: Secondary | ICD-10-CM

## 2014-05-25 DIAGNOSIS — R059 Cough, unspecified: Secondary | ICD-10-CM

## 2015-12-28 ENCOUNTER — Other Ambulatory Visit (HOSPITAL_COMMUNITY): Payer: Self-pay | Admitting: Specialist

## 2015-12-28 ENCOUNTER — Ambulatory Visit (HOSPITAL_COMMUNITY)
Admission: RE | Admit: 2015-12-28 | Discharge: 2015-12-28 | Disposition: A | Discharge status: Home / Self Care | Payer: 59 | Source: Ambulatory Visit | Attending: Cardiovascular Disease | Admitting: Cardiovascular Disease

## 2015-12-28 DIAGNOSIS — M79605 Pain in left leg: Secondary | ICD-10-CM

## 2016-04-25 DIAGNOSIS — R05 Cough: Secondary | ICD-10-CM | POA: Diagnosis not present

## 2016-04-25 DIAGNOSIS — R062 Wheezing: Secondary | ICD-10-CM | POA: Diagnosis not present

## 2016-05-09 DIAGNOSIS — M961 Postlaminectomy syndrome, not elsewhere classified: Secondary | ICD-10-CM | POA: Diagnosis not present

## 2016-05-09 DIAGNOSIS — G894 Chronic pain syndrome: Secondary | ICD-10-CM | POA: Diagnosis not present

## 2016-05-09 DIAGNOSIS — Z79891 Long term (current) use of opiate analgesic: Secondary | ICD-10-CM | POA: Diagnosis not present

## 2016-07-05 DIAGNOSIS — L0291 Cutaneous abscess, unspecified: Secondary | ICD-10-CM | POA: Diagnosis not present

## 2016-08-29 DIAGNOSIS — G894 Chronic pain syndrome: Secondary | ICD-10-CM | POA: Diagnosis not present

## 2016-08-29 DIAGNOSIS — Z79891 Long term (current) use of opiate analgesic: Secondary | ICD-10-CM | POA: Diagnosis not present

## 2016-08-29 DIAGNOSIS — M961 Postlaminectomy syndrome, not elsewhere classified: Secondary | ICD-10-CM | POA: Diagnosis not present

## 2016-09-17 DIAGNOSIS — I10 Essential (primary) hypertension: Secondary | ICD-10-CM | POA: Diagnosis not present

## 2016-09-17 DIAGNOSIS — E7800 Pure hypercholesterolemia, unspecified: Secondary | ICD-10-CM | POA: Diagnosis not present

## 2016-10-09 DIAGNOSIS — M1712 Unilateral primary osteoarthritis, left knee: Secondary | ICD-10-CM | POA: Diagnosis not present

## 2016-10-09 DIAGNOSIS — M23332 Other meniscus derangements, other medial meniscus, left knee: Secondary | ICD-10-CM | POA: Diagnosis not present

## 2016-10-09 DIAGNOSIS — Z4789 Encounter for other orthopedic aftercare: Secondary | ICD-10-CM | POA: Diagnosis not present

## 2016-10-22 DIAGNOSIS — M25562 Pain in left knee: Secondary | ICD-10-CM | POA: Diagnosis not present

## 2016-10-25 DIAGNOSIS — Z1231 Encounter for screening mammogram for malignant neoplasm of breast: Secondary | ICD-10-CM | POA: Diagnosis not present

## 2016-10-26 DIAGNOSIS — Z79891 Long term (current) use of opiate analgesic: Secondary | ICD-10-CM | POA: Diagnosis not present

## 2016-10-26 DIAGNOSIS — M961 Postlaminectomy syndrome, not elsewhere classified: Secondary | ICD-10-CM | POA: Diagnosis not present

## 2016-10-26 DIAGNOSIS — G894 Chronic pain syndrome: Secondary | ICD-10-CM | POA: Diagnosis not present

## 2016-10-31 DIAGNOSIS — Z4789 Encounter for other orthopedic aftercare: Secondary | ICD-10-CM | POA: Diagnosis not present

## 2016-10-31 DIAGNOSIS — M23332 Other meniscus derangements, other medial meniscus, left knee: Secondary | ICD-10-CM | POA: Diagnosis not present

## 2016-10-31 DIAGNOSIS — M1712 Unilateral primary osteoarthritis, left knee: Secondary | ICD-10-CM | POA: Diagnosis not present

## 2016-11-22 DIAGNOSIS — M064 Inflammatory polyarthropathy: Secondary | ICD-10-CM | POA: Diagnosis not present

## 2016-11-22 DIAGNOSIS — M79643 Pain in unspecified hand: Secondary | ICD-10-CM | POA: Diagnosis not present

## 2016-11-22 DIAGNOSIS — M25569 Pain in unspecified knee: Secondary | ICD-10-CM | POA: Diagnosis not present

## 2016-11-22 DIAGNOSIS — M549 Dorsalgia, unspecified: Secondary | ICD-10-CM | POA: Diagnosis not present

## 2016-12-09 ENCOUNTER — Emergency Department
Admission: EM | Admit: 2016-12-09 | Discharge: 2016-12-09 | Disposition: A | Discharge status: Home / Self Care | Payer: 59 | Attending: Emergency Medicine | Admitting: Emergency Medicine

## 2016-12-09 ENCOUNTER — Emergency Department: Payer: 59

## 2016-12-09 ENCOUNTER — Encounter: Payer: Self-pay | Admitting: Emergency Medicine

## 2016-12-09 DIAGNOSIS — W19XXXA Unspecified fall, initial encounter: Secondary | ICD-10-CM

## 2016-12-09 DIAGNOSIS — S199XXA Unspecified injury of neck, initial encounter: Secondary | ICD-10-CM | POA: Diagnosis not present

## 2016-12-09 DIAGNOSIS — S0990XA Unspecified injury of head, initial encounter: Secondary | ICD-10-CM | POA: Diagnosis not present

## 2016-12-09 DIAGNOSIS — Y92009 Unspecified place in unspecified non-institutional (private) residence as the place of occurrence of the external cause: Secondary | ICD-10-CM | POA: Insufficient documentation

## 2016-12-09 DIAGNOSIS — G8929 Other chronic pain: Secondary | ICD-10-CM | POA: Insufficient documentation

## 2016-12-09 DIAGNOSIS — Y999 Unspecified external cause status: Secondary | ICD-10-CM | POA: Diagnosis not present

## 2016-12-09 DIAGNOSIS — S139XXA Sprain of joints and ligaments of unspecified parts of neck, initial encounter: Secondary | ICD-10-CM | POA: Diagnosis not present

## 2016-12-09 DIAGNOSIS — S99922A Unspecified injury of left foot, initial encounter: Secondary | ICD-10-CM | POA: Diagnosis not present

## 2016-12-09 DIAGNOSIS — M79605 Pain in left leg: Secondary | ICD-10-CM | POA: Diagnosis not present

## 2016-12-09 DIAGNOSIS — M79672 Pain in left foot: Secondary | ICD-10-CM

## 2016-12-09 DIAGNOSIS — W108XXA Fall (on) (from) other stairs and steps, initial encounter: Secondary | ICD-10-CM | POA: Diagnosis not present

## 2016-12-09 DIAGNOSIS — Y9301 Activity, walking, marching and hiking: Secondary | ICD-10-CM | POA: Diagnosis not present

## 2016-12-09 DIAGNOSIS — I10 Essential (primary) hypertension: Secondary | ICD-10-CM | POA: Insufficient documentation

## 2016-12-09 DIAGNOSIS — S8992XA Unspecified injury of left lower leg, initial encounter: Secondary | ICD-10-CM | POA: Diagnosis not present

## 2016-12-09 NOTE — Discharge Instructions (Signed)
Wear knee immobilizer and use your walker as needed. Elevate affected area and apply ice several times daily. You may continue to take your regular pain medicines. Return to the ER for worsening symptoms, vomiting, difficulty breathing or other concerns.

## 2016-12-09 NOTE — ED Provider Notes (Signed)
[facility] Emergency Department Provider Note   ____________________________________________   First MD Initiated Contact with Patient 12/09/16 [phone]     (approximate)  I have reviewed the triage vital signs and the nursing notes.   HISTORY  Chief Complaint Fall    HPI [name] is a 61 y.o. female who presents to the ED from home with a chief complaint of fall. Patient reports she is a chronic pain patient, seeing orthopedics for generalized arthritis, mostly in her left knee. States she was walking downstairs, trying to keep her left knee straight when she fell down 3-7 steps. She did strike her head but did not suffer LOC. Complains of head, neck, left lower leg and left foot pain. Denies vision changes, chest pain, shortness of breath, abdominal pain, nausea, vomiting. Nothing makes her symptoms better. Movement makes her symptoms worse.   Past medical history Chronic back pain, unspecified   HTN (hypertension)   HLD (hyperlipidemia), unspecified   Obesity, unspecified     There are no active problems to display for this patient.   Past surgical history NECK SURGERY - CERVICAL FUSION     BACK SURGERY - LUMBAR FUSION     CESAREAN SECTION     HYSTERECTOMY        Prior to Admission medications   Not on File    Allergies Erythromycin; Novocain [procaine]; and Robaxin [methocarbamol]  No family history on file.  Social History Social History  Substance Use Topics  . Smoking status: Not on file  . Smokeless tobacco: Not on file  . Alcohol use Not on file    Review of Systems  Constitutional: No fever/chills. Eyes: No visual changes. ENT: No sore throat. Cardiovascular: Denies chest pain. Respiratory: Denies shortness of breath. Gastrointestinal: No abdominal pain.  No nausea, no vomiting.  No diarrhea.  No constipation. Genitourinary: Negative for dysuria. Musculoskeletal: positive for neck and left leg/foot  painNegative for back pain. Skin: Negative for rash. Neurological: Negative for headaches, focal weakness or numbness.   ____________________________________________   PHYSICAL EXAM:  VITAL SIGNS: ED Triage Vitals  Enc Vitals Group     BP 12/09/16 0157 (!) 130/58     Pulse Rate 12/09/16 0157 (!) 59     Resp 12/09/16 0157 16     Temp 12/09/16 0157 97.6 F (36.4 C)     Temp src --      SpO2 12/09/16 0157 98 %     Weight 12/09/16 0157 180 lb (81.6 kg)     Height 12/09/16 0157 5\' 5"  (1.651 m)     Head Circumference --      Peak Flow --      Pain Score 12/09/16 0156 6     Pain Loc --      Pain Edu? --      Excl. in [place]? --     Constitutional: Alert and oriented. Well appearing and in mild acute distress. Eyes: Conjunctivae are normal. PERRL. EOMI. Head: Atraumatic. Nose: No congestion/rhinnorhea. Mouth/Throat: Mucous membranes are moist.  Oropharynx non-erythematous. Neck: No stridor.  No midline cervical spine tenderness to palpation. Paraspinal muscle spasms. Cardiovascular: Normal rate, regular rhythm. Grossly normal heart sounds.  Good peripheral circulation. Respiratory: Normal respiratory effort.  No retractions. Lungs CTAB. Gastrointestinal: Soft and nontender. No distention. No abdominal bruits. No CVA tenderness. Musculoskeletal: Baseline LLE swelling per patient. Left lateral knee/ower leg tender to palpation. Limited range of motion secondary to pain and pre-existing arthritis. Dorsal left foot tender  to palpation with limited range of motion secondary to pain. 2+ femoral distal pulses. Brisk, less than 5 second capillary refill. Neurologic:  Normal speech and language. No gross focal neurologic deficits are appreciated.  Skin:  Skin is warm, dry and intact. No rash noted. Psychiatric: Mood and affect are normal. Speech and behavior are normal.  ____________________________________________   LABS (all labs ordered are listed, but only abnormal results are  displayed)  Labs Reviewed - No data to display ____________________________________________  EKG  none ____________________________________________  RADIOLOGY  Dg Tibia/fibula Left  Result Date: 12/09/2016 CLINICAL DATA:  Diffuse left leg pain after fall down stairs. EXAM: LEFT TIBIA AND FIBULA - 2 VIEW COMPARISON:  None. FINDINGS: There is no evidence of fracture or other focal bone lesions. Knee and ankle alignment is maintained, mild chronic degenerative change about the knee. Prominent calcaneal spur. Mild soft tissue edema. IMPRESSION: No fracture of the left lower leg. Electronically Signed   By: Jeb Levering M.D.   On: 12/09/2016 02:44   Ct Head Wo Contrast  Result Date: 12/09/2016 CLINICAL DATA:  Patient fell down 3-7 steps striking head in injuring left lower leg. Complains of right posterior neck pain. EXAM: CT HEAD WITHOUT CONTRAST CT CERVICAL SPINE WITHOUT CONTRAST TECHNIQUE: Multidetector CT imaging of the head and cervical spine was performed following the standard protocol without intravenous contrast. Multiplanar CT image reconstructions of the cervical spine were also generated. COMPARISON:  None. FINDINGS: CT HEAD FINDINGS BRAIN: Mild superficial atrophy. No hydrocephalus. No intraparenchymal hemorrhage, mass effect nor midline shift. No acute large vascular territory infarcts. No abnormal extra-axial fluid collections. Basal cisterns are midline and not effaced. No acute cerebellar abnormality. Small rounded perivascular CSF space in the right left temporal lobe versus chronic lacunar infarct. VASCULAR: No hyperdense vessels. SKULL/SOFT TISSUES: No skull fracture. No significant soft tissue swelling. ORBITS/SINUSES: The included ocular globes and orbital contents are normal.The mastoid air-cells and included paranasal sinuses are well-aerated. OTHER: None. CT CERVICAL SPINE FINDINGS ALIGNMENT: Vertebral bodies in alignment. Maintained lordosis. SKULL BASE AND VERTEBRAE:  Cervical vertebral bodies and posterior elements are intact. Segmentation anomaly with fusion of C2-3 is again seen. Anterior cervical discectomy from C5 through C7 with bony incorporation and fusion noted. No destructive bony lesions. C1-2 articulation maintained. SOFT TISSUES AND SPINAL CANAL: Normal. DISC LEVELS: Apart from the areas of previously described fusion at C2-3 and C5 through C7, there is mild disc space narrowing at C3-4 and C4-5 as well as T1-T2. No jumped or perched facets. UPPER CHEST: Lung apices are clear. OTHER: None. IMPRESSION: 1. No acute intracranial abnormality. 2. 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 No acute cervical spine fracture. Electronically Signed   By: Ashley Royalty M.D.   On: 12/09/2016 03:01   Ct Cervical Spine Wo Contrast  Result Date: 12/09/2016 CLINICAL DATA:  Patient fell down 3-7 steps striking head in injuring left lower leg. Complains of right posterior neck pain. EXAM: CT HEAD WITHOUT CONTRAST CT CERVICAL SPINE WITHOUT CONTRAST TECHNIQUE: Multidetector CT imaging of the head and cervical spine was performed following the standard protocol without intravenous contrast. Multiplanar CT image reconstructions of the cervical spine were also generated. COMPARISON:  None. FINDINGS: CT HEAD FINDINGS BRAIN: Mild superficial atrophy. No hydrocephalus. No intraparenchymal hemorrhage, mass effect nor midline shift. No acute large vascular territory infarcts. No abnormal extra-axial fluid collections. Basal cisterns are midline and not effaced. No acute cerebellar abnormality. Small  rounded perivascular CSF space in the right left temporal lobe versus chronic lacunar infarct. VASCULAR: No hyperdense vessels. SKULL/SOFT TISSUES: No skull fracture. No significant soft tissue swelling. ORBITS/SINUSES: The included ocular globes and orbital contents are normal.The mastoid air-cells and included  paranasal sinuses are well-aerated. OTHER: None. CT CERVICAL SPINE FINDINGS ALIGNMENT: Vertebral bodies in alignment. Maintained lordosis. SKULL BASE AND VERTEBRAE: Cervical vertebral bodies and posterior elements are intact. Segmentation anomaly with fusion of C2-3 is again seen. Anterior cervical discectomy from C5 through C7 with bony incorporation and fusion noted. No destructive bony lesions. C1-2 articulation maintained. SOFT TISSUES AND SPINAL CANAL: Normal. DISC LEVELS: Apart from the areas of previously described fusion at C2-3 and C5 through C7, there is mild disc space narrowing at C3-4 and C4-5 as well as T1-T2. No jumped or perched facets. UPPER CHEST: Lung apices are clear. OTHER: None. IMPRESSION: 1. No acute intracranial abnormality. 2. 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 No acute cervical spine fracture. Electronically Signed   By: Ashley Royalty M.D.   On: 12/09/2016 03:01   Dg Foot Complete Left  Result Date: 12/09/2016 CLINICAL DATA:  Fall down steps with left foot pain. EXAM: LEFT FOOT - COMPLETE 3+ VIEW COMPARISON:  None. FINDINGS: No fracture or dislocation. Prominent plantar calcaneal spur. Osteoarthritis of the first metatarsal phalangeal joint with joint space narrowing and osteophytes. Incidental os peroneum. Mild dorsal soft tissue edema. IMPRESSION: 1. No acute fracture or subluxation of the left foot. 2. Degenerative change of the first metatarsal phalangeal joint. Electronically Signed   By: Jeb Levering M.D.   On: 12/09/2016 04:54    ____________________________________________   PROCEDURES  Procedure(s) performed: None  Procedures  Critical Care performed: No  ____________________________________________   INITIAL IMPRESSION / ASSESSMENT AND PLAN / ED COURSE  Pertinent labs & imaging results that were available during my care of the patient were reviewed by me and  considered in my medical decision making (see chart for details).  61 year old female who presents with neck and left leg pain status post mechanical fall. CT and x-rays are negative for acute trauma Patient is a chronic pain patient; does not desire analgesia or NSAIDs. Will place in a knee brace; patient has walker at home to use. Patient will follow up closely with her orthopedist. Strict return precautions given. Patient verbalizes understanding and agrees with plan of care.      ____________________________________________   FINAL CLINICAL IMPRESSION(S) / ED DIAGNOSES  Final diagnoses:  Fall, initial encounter  Minor head injury, initial encounter  Cervical sprain, initial encounter  Left foot pain  Left leg pain      NEW MEDICATIONS STARTED DURING THIS VISIT:  New Prescriptions   No medications on file     Note:  This document was prepared using Dragon voice recognition software and may include unintentional dictation errors.    Paulette Blanch, MD 12/09/16 0700

## 2016-12-09 NOTE — ED Triage Notes (Signed)
Pt states she fell down 3-7 steps striking head and injuring left lower leg. Pt complains of neck pain, left lower leg pain. Splint in place from home. Pt denies loc. c collar applied in triage.

## 2016-12-21 ENCOUNTER — Ambulatory Visit
Admission: RE | Admit: 2016-12-21 | Discharge: 2016-12-21 | Disposition: A | Discharge status: Home / Self Care | Payer: 59 | Source: Ambulatory Visit | Attending: Anesthesiology | Admitting: Anesthesiology

## 2016-12-21 ENCOUNTER — Other Ambulatory Visit: Payer: Self-pay | Admitting: Anesthesiology

## 2016-12-21 DIAGNOSIS — W19XXXA Unspecified fall, initial encounter: Secondary | ICD-10-CM

## 2016-12-21 DIAGNOSIS — M545 Low back pain: Secondary | ICD-10-CM | POA: Diagnosis not present

## 2016-12-21 DIAGNOSIS — R52 Pain, unspecified: Secondary | ICD-10-CM

## 2016-12-21 DIAGNOSIS — S299XXA Unspecified injury of thorax, initial encounter: Secondary | ICD-10-CM | POA: Diagnosis not present

## 2016-12-21 DIAGNOSIS — M546 Pain in thoracic spine: Secondary | ICD-10-CM | POA: Diagnosis not present

## 2016-12-21 DIAGNOSIS — G894 Chronic pain syndrome: Secondary | ICD-10-CM | POA: Diagnosis not present

## 2016-12-27 DIAGNOSIS — M79643 Pain in unspecified hand: Secondary | ICD-10-CM | POA: Diagnosis not present

## 2016-12-27 DIAGNOSIS — M549 Dorsalgia, unspecified: Secondary | ICD-10-CM | POA: Diagnosis not present

## 2016-12-27 DIAGNOSIS — M25569 Pain in unspecified knee: Secondary | ICD-10-CM | POA: Diagnosis not present

## 2017-01-17 DIAGNOSIS — M79643 Pain in unspecified hand: Secondary | ICD-10-CM | POA: Diagnosis not present

## 2017-01-17 DIAGNOSIS — M549 Dorsalgia, unspecified: Secondary | ICD-10-CM | POA: Diagnosis not present

## 2017-01-17 DIAGNOSIS — M25569 Pain in unspecified knee: Secondary | ICD-10-CM | POA: Diagnosis not present

## 2017-02-14 DIAGNOSIS — M549 Dorsalgia, unspecified: Secondary | ICD-10-CM | POA: Diagnosis not present

## 2017-02-14 DIAGNOSIS — M25569 Pain in unspecified knee: Secondary | ICD-10-CM | POA: Diagnosis not present

## 2017-02-14 DIAGNOSIS — M79643 Pain in unspecified hand: Secondary | ICD-10-CM | POA: Diagnosis not present

## 2017-02-25 DIAGNOSIS — G894 Chronic pain syndrome: Secondary | ICD-10-CM | POA: Diagnosis not present

## 2017-02-25 DIAGNOSIS — D1801 Hemangioma of skin and subcutaneous tissue: Secondary | ICD-10-CM | POA: Diagnosis not present

## 2017-02-25 DIAGNOSIS — M545 Low back pain: Secondary | ICD-10-CM | POA: Diagnosis not present

## 2017-02-25 DIAGNOSIS — L821 Other seborrheic keratosis: Secondary | ICD-10-CM | POA: Diagnosis not present

## 2017-02-25 DIAGNOSIS — M546 Pain in thoracic spine: Secondary | ICD-10-CM | POA: Diagnosis not present

## 2017-02-25 DIAGNOSIS — L814 Other melanin hyperpigmentation: Secondary | ICD-10-CM | POA: Diagnosis not present

## 2017-03-05 DIAGNOSIS — J0140 Acute pansinusitis, unspecified: Secondary | ICD-10-CM | POA: Diagnosis not present

## 2017-03-05 DIAGNOSIS — J029 Acute pharyngitis, unspecified: Secondary | ICD-10-CM | POA: Diagnosis not present

## 2017-04-22 DIAGNOSIS — M546 Pain in thoracic spine: Secondary | ICD-10-CM | POA: Diagnosis not present

## 2017-04-22 DIAGNOSIS — M545 Low back pain: Secondary | ICD-10-CM | POA: Diagnosis not present

## 2017-04-22 DIAGNOSIS — G894 Chronic pain syndrome: Secondary | ICD-10-CM | POA: Diagnosis not present

## 2017-04-26 DIAGNOSIS — I10 Essential (primary) hypertension: Secondary | ICD-10-CM | POA: Diagnosis not present

## 2017-04-26 DIAGNOSIS — E7800 Pure hypercholesterolemia, unspecified: Secondary | ICD-10-CM | POA: Diagnosis not present

## 2017-04-26 DIAGNOSIS — Z0000 Encounter for general adult medical examination without abnormal findings: Secondary | ICD-10-CM | POA: Diagnosis not present

## 2017-06-17 DIAGNOSIS — G894 Chronic pain syndrome: Secondary | ICD-10-CM | POA: Diagnosis not present

## 2017-06-17 DIAGNOSIS — Z79891 Long term (current) use of opiate analgesic: Secondary | ICD-10-CM | POA: Diagnosis not present

## 2017-06-17 DIAGNOSIS — M545 Low back pain: Secondary | ICD-10-CM | POA: Diagnosis not present

## 2017-06-27 DIAGNOSIS — Z79891 Long term (current) use of opiate analgesic: Secondary | ICD-10-CM | POA: Diagnosis not present

## 2017-06-27 DIAGNOSIS — G894 Chronic pain syndrome: Secondary | ICD-10-CM | POA: Diagnosis not present

## 2017-09-21 DIAGNOSIS — J181 Lobar pneumonia, unspecified organism: Secondary | ICD-10-CM | POA: Diagnosis not present

## 2017-09-25 DIAGNOSIS — J189 Pneumonia, unspecified organism: Secondary | ICD-10-CM | POA: Diagnosis not present

## 2017-10-30 DIAGNOSIS — M79643 Pain in unspecified hand: Secondary | ICD-10-CM | POA: Diagnosis not present

## 2017-10-30 DIAGNOSIS — M25569 Pain in unspecified knee: Secondary | ICD-10-CM | POA: Diagnosis not present

## 2017-10-30 DIAGNOSIS — Z23 Encounter for immunization: Secondary | ICD-10-CM | POA: Diagnosis not present

## 2017-10-30 DIAGNOSIS — M549 Dorsalgia, unspecified: Secondary | ICD-10-CM | POA: Diagnosis not present

## 2017-10-30 DIAGNOSIS — Z1231 Encounter for screening mammogram for malignant neoplasm of breast: Secondary | ICD-10-CM | POA: Diagnosis not present

## 2017-11-05 DIAGNOSIS — M545 Low back pain: Secondary | ICD-10-CM | POA: Diagnosis not present

## 2017-11-05 DIAGNOSIS — G894 Chronic pain syndrome: Secondary | ICD-10-CM | POA: Diagnosis not present

## 2017-11-05 DIAGNOSIS — Z79891 Long term (current) use of opiate analgesic: Secondary | ICD-10-CM | POA: Diagnosis not present

## 2017-12-04 DIAGNOSIS — L4050 Arthropathic psoriasis, unspecified: Secondary | ICD-10-CM | POA: Diagnosis not present

## 2017-12-11 DIAGNOSIS — M79643 Pain in unspecified hand: Secondary | ICD-10-CM | POA: Diagnosis not present

## 2017-12-11 DIAGNOSIS — M25569 Pain in unspecified knee: Secondary | ICD-10-CM | POA: Diagnosis not present

## 2017-12-11 DIAGNOSIS — M549 Dorsalgia, unspecified: Secondary | ICD-10-CM | POA: Diagnosis not present

## 2018-01-01 DIAGNOSIS — L4050 Arthropathic psoriasis, unspecified: Secondary | ICD-10-CM | POA: Diagnosis not present

## 2018-01-13 DIAGNOSIS — G894 Chronic pain syndrome: Secondary | ICD-10-CM | POA: Diagnosis not present

## 2018-01-13 DIAGNOSIS — Z79891 Long term (current) use of opiate analgesic: Secondary | ICD-10-CM | POA: Diagnosis not present

## 2018-01-13 DIAGNOSIS — M545 Low back pain: Secondary | ICD-10-CM | POA: Diagnosis not present

## 2018-02-26 DIAGNOSIS — L4050 Arthropathic psoriasis, unspecified: Secondary | ICD-10-CM | POA: Diagnosis not present

## 2018-03-11 DIAGNOSIS — M545 Low back pain: Secondary | ICD-10-CM | POA: Diagnosis not present

## 2018-03-11 DIAGNOSIS — Z79891 Long term (current) use of opiate analgesic: Secondary | ICD-10-CM | POA: Diagnosis not present

## 2018-03-11 DIAGNOSIS — G894 Chronic pain syndrome: Secondary | ICD-10-CM | POA: Diagnosis not present

## 2018-03-15 IMAGING — CT CT HEAD W/O CM
3 series · 15 of 47 positions shown, 18 images · non-contrast
Comparison: None.

CLINICAL DATA: Patient fell down 3-7 steps striking head in
injuring left lower leg. Complains of right posterior neck pain.

EXAM:
CT HEAD WITHOUT CONTRAST
CT CERVICAL SPINE WITHOUT CONTRAST
TECHNIQUE: Multidetector CT imaging of the head and cervical spine was
performed following the standard protocol without intravenous
contrast. Multiplanar CT image reconstructions of the cervical spine
were also generated.

[Series 2: head wo · axial · 0.39mm/px · z∈[-122,+8]mm · 9 of 32 slices shown, 12 images]
[im 3/32  brain]
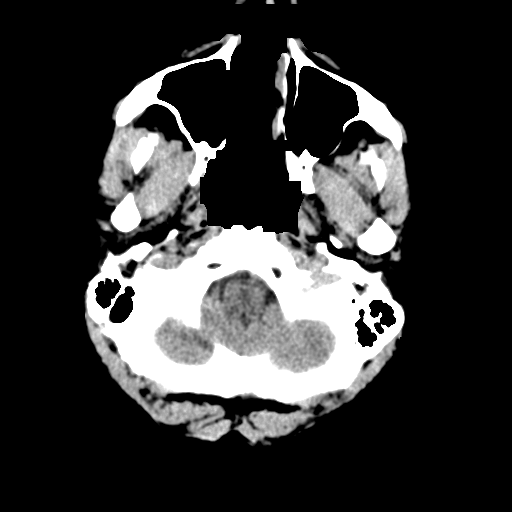
[im 3/32  bone]
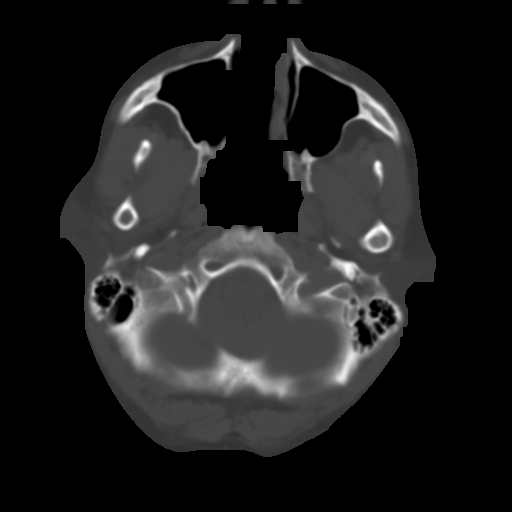
[im 6/32  brain]
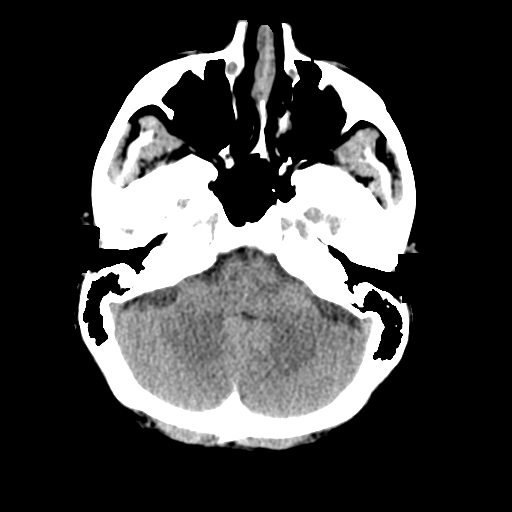
[im 9/32  brain]
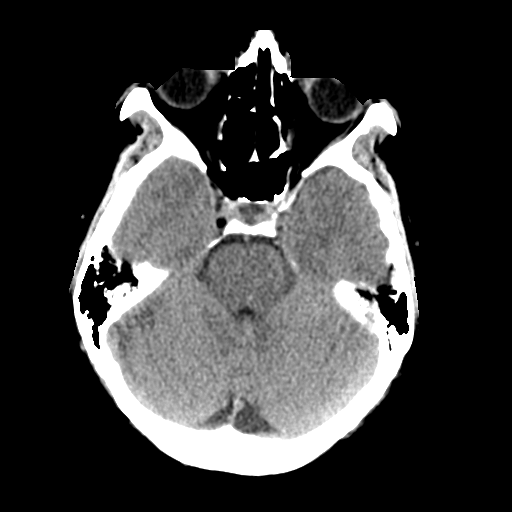
[im 12/32  brain]
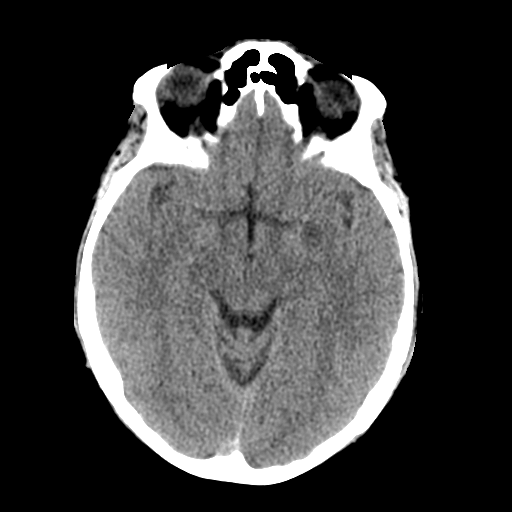
[im 17/32  brain]
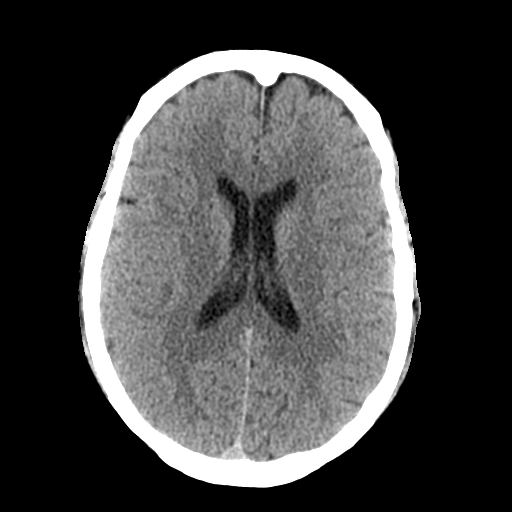
[im 17/32  bone]
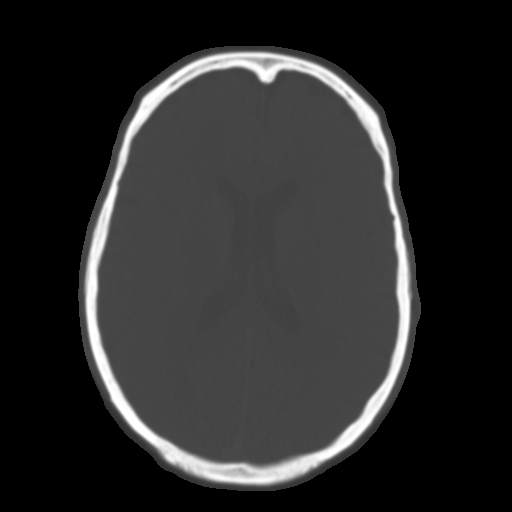
[im 20/32  brain]
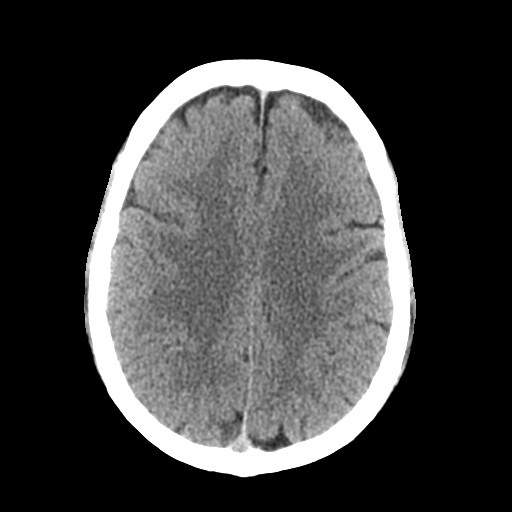
[im 23/32  brain]
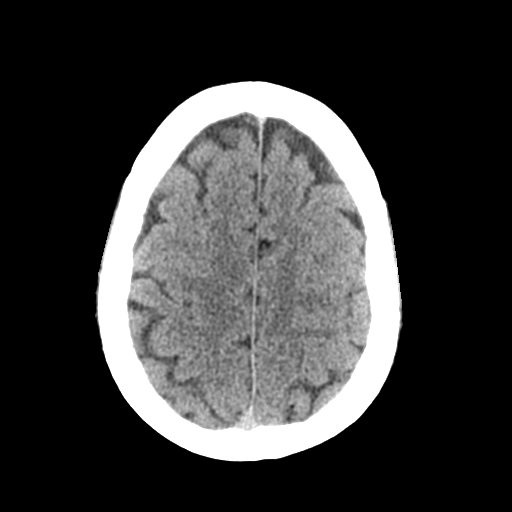
[im 26/32  brain]
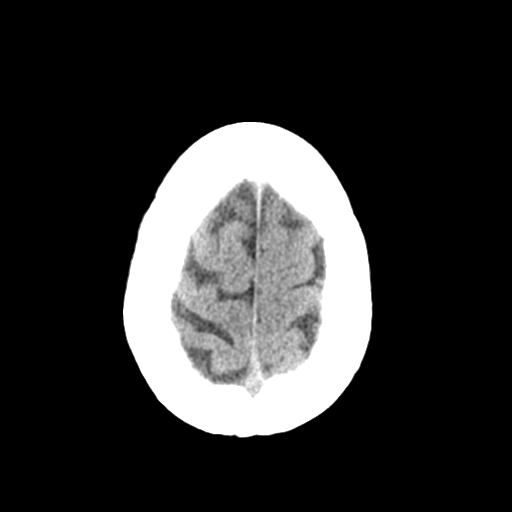
[im 29/32  brain]
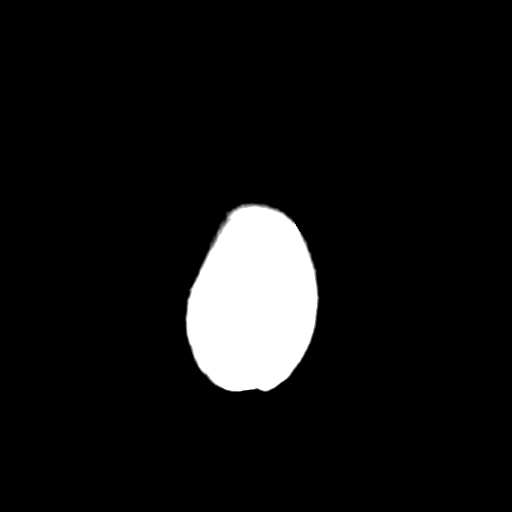
[im 29/32  bone]
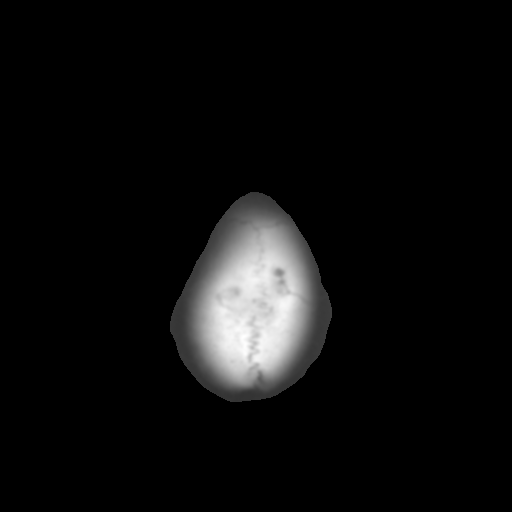

[Series 4: coronal soft tissue · coronal · 0.31mm/px · 3 of 61 slices shown]
[im 21/61  brain]
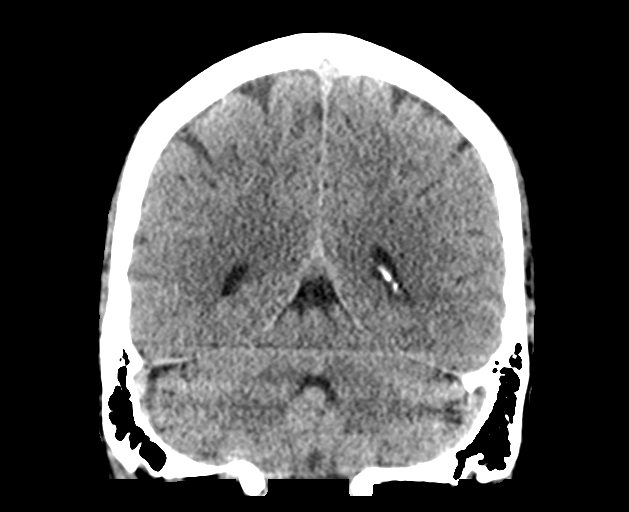
[im 27/61  brain]
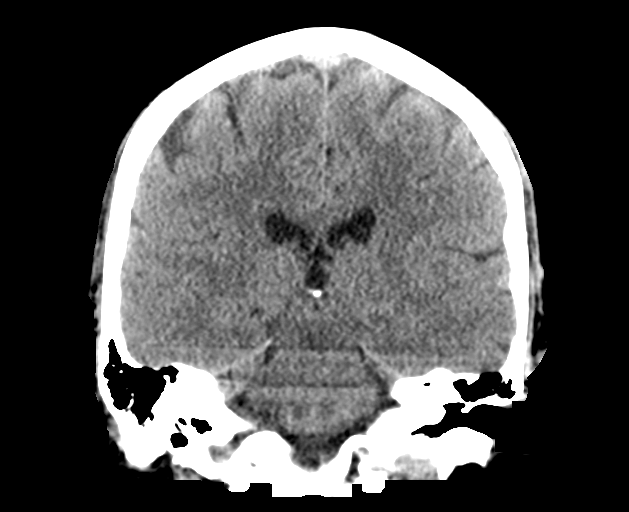
[im 34/61  brain]
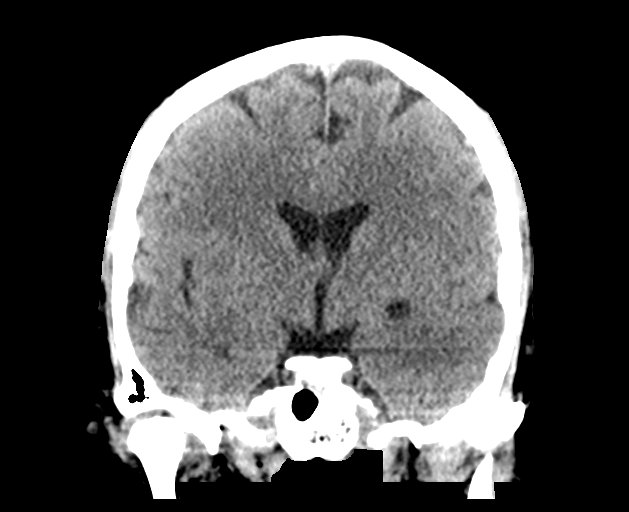

[Series 5: sagittal soft tissue · sagittal · 0.34mm/px · 3 of 49 slices shown]
[im 17/49  brain]
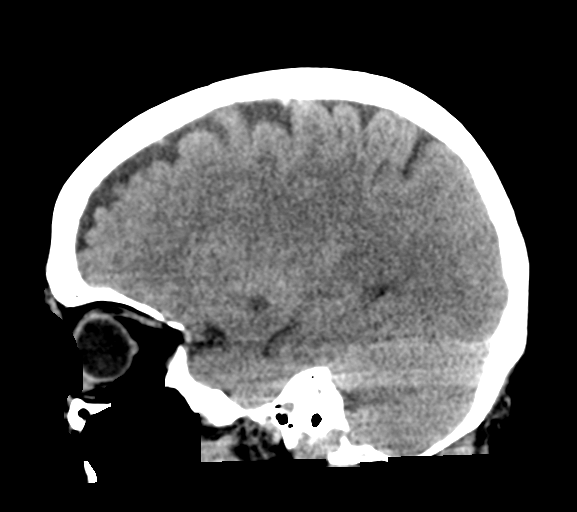
[im 25/49  brain]
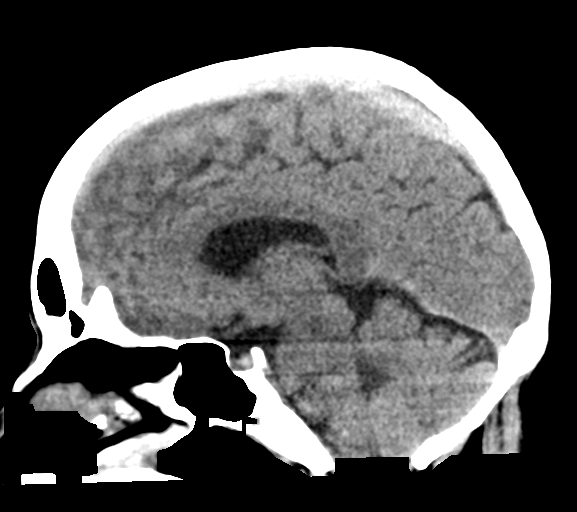
[im 33/49  brain]
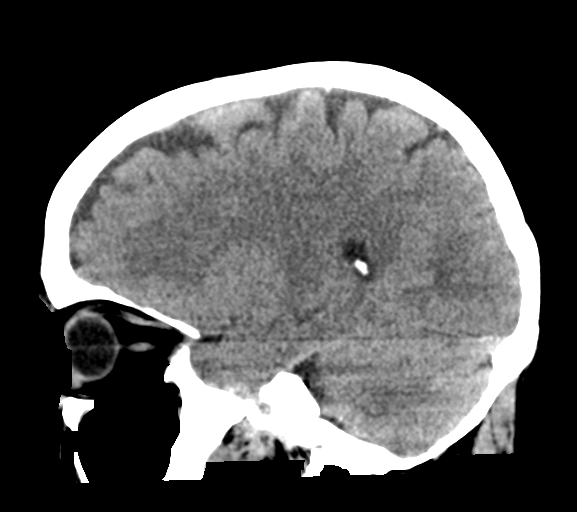

[15 of 47 positions shown; findings below may reference images not displayed]

FINDINGS: CT HEAD FINDINGS

BRAIN: Mild superficial atrophy. No hydrocephalus. No
intraparenchymal hemorrhage, mass effect nor midline shift. No acute
large vascular territory infarcts. No abnormal extra-axial fluid
collections. Basal cisterns are midline and not effaced. No acute
cerebellar abnormality. Small rounded perivascular CSF space in the
right left temporal lobe versus chronic lacunar infarct.

VASCULAR: No hyperdense vessels.

SKULL/SOFT TISSUES: No skull fracture. No significant soft tissue
swelling.

ORBITS/SINUSES: The included ocular globes and orbital contents are
normal.The mastoid air-cells and included paranasal sinuses are
well-aerated.

OTHER: None.

CT CERVICAL SPINE FINDINGS

ALIGNMENT: Vertebral bodies in alignment. Maintained lordosis.

SKULL BASE AND VERTEBRAE: Cervical vertebral bodies and posterior
elements are intact. Segmentation anomaly with fusion of C2-3 is
again seen. Anterior cervical discectomy from C5 through C7 with
bony incorporation and fusion noted. No destructive bony lesions.
C1-2 articulation maintained.

SOFT TISSUES AND SPINAL CANAL: Normal.

DISC LEVELS: Apart from the areas of previously described fusion at
C2-3 and C5 through C7, there is mild disc space narrowing at C3-4
and C4-5 as well as T1-T2. No jumped or perched facets.

UPPER CHEST: Lung apices are clear.

OTHER: None.
IMPRESSION: 1. No acute intracranial abnormality.
2. Chronic stable anterior cervical discectomy and fusion from C5
through C7 with segmentation anomaly and partial fusion at C2-3. No
acute cervical spine fracture.

## 2018-03-15 IMAGING — DX DG FOOT COMPLETE 3+V*L*
3 series · 4 of 4 positions shown · non-contrast
Comparison: None.

CLINICAL DATA: Fall down steps with left foot pain.

EXAM:
LEFT FOOT - COMPLETE 3+ VIEW

[foot ap]
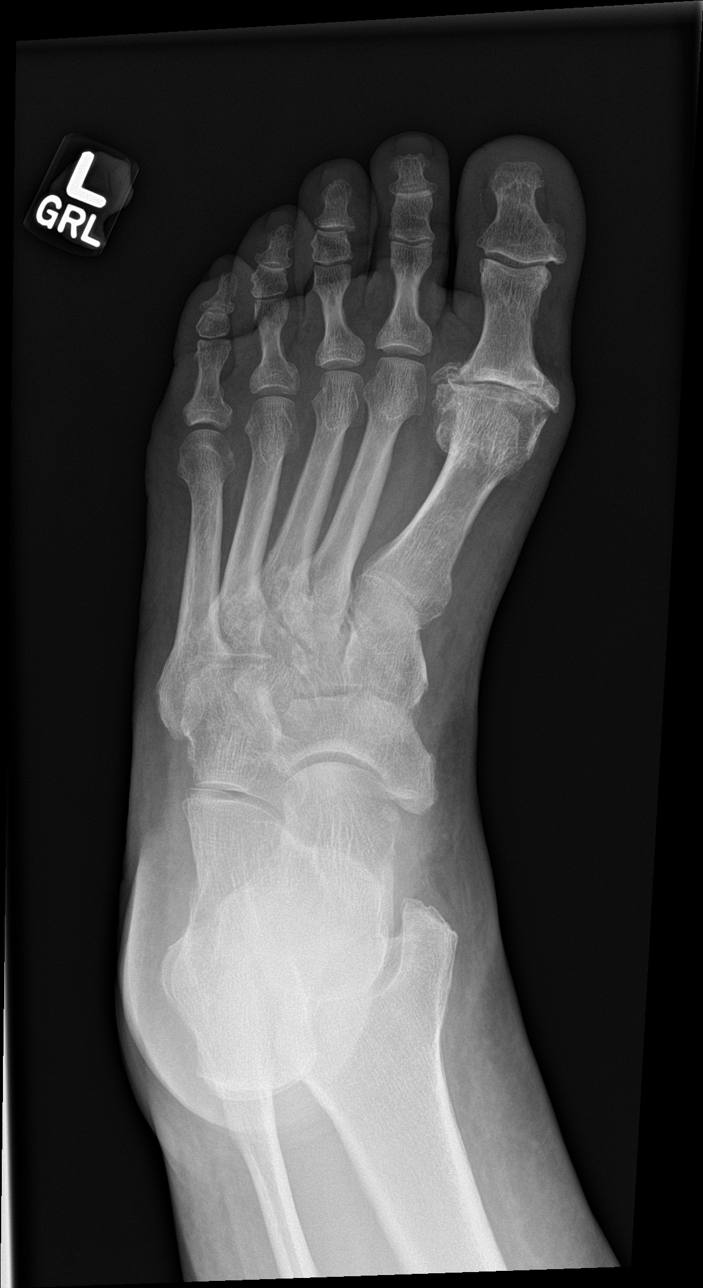

[Series 3: foot lat · 0.14mm/px · 2 of 2 slices shown]
[im 1/2]
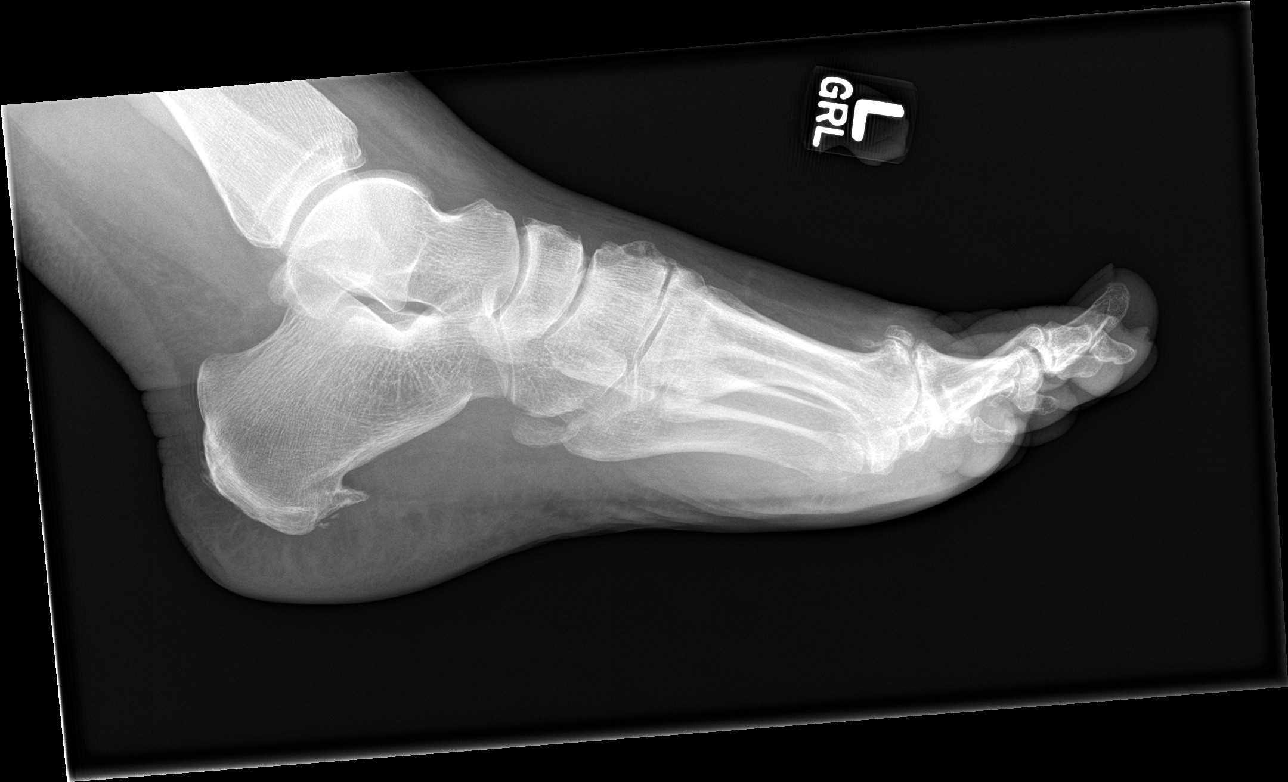
[im 2/2]
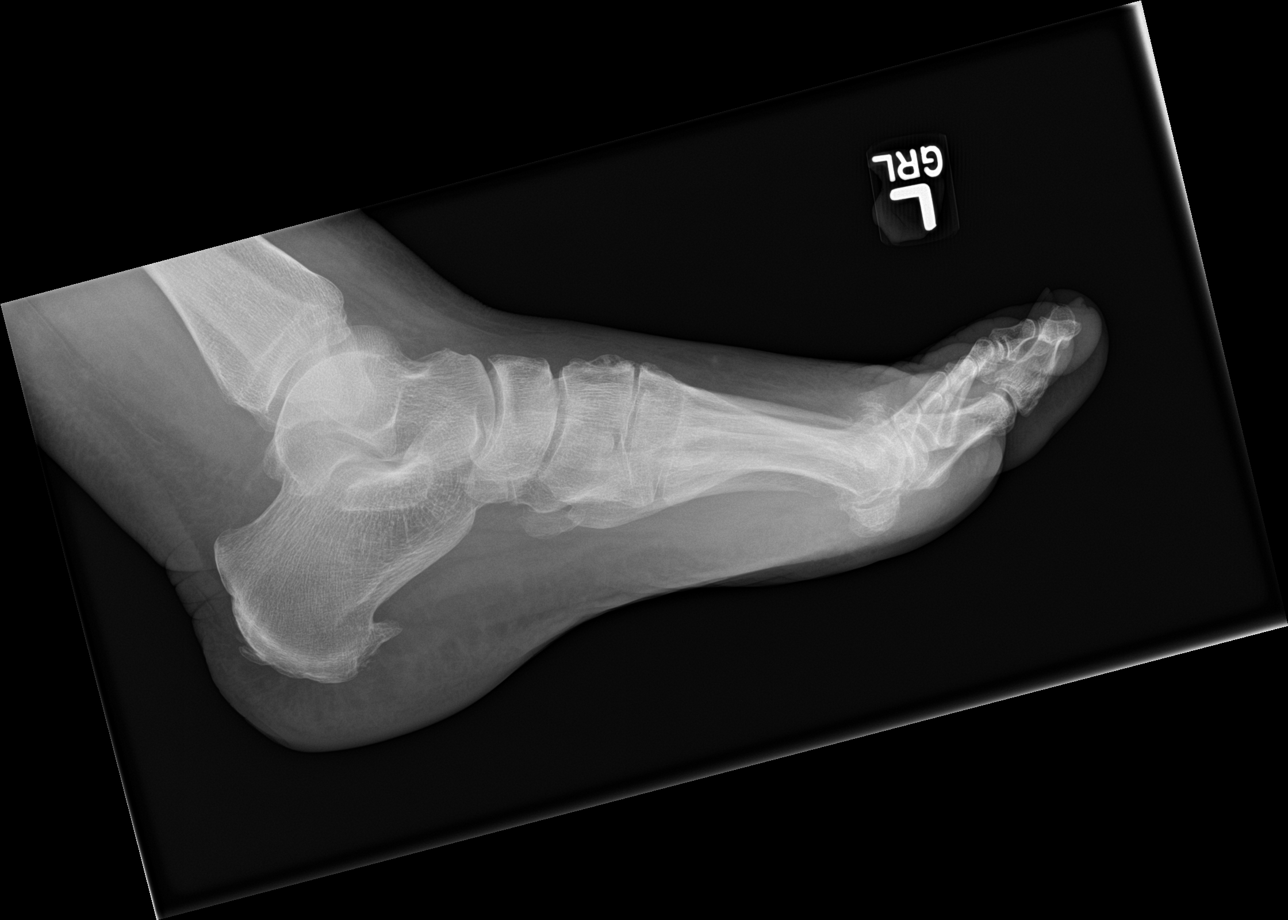

[foot obl]
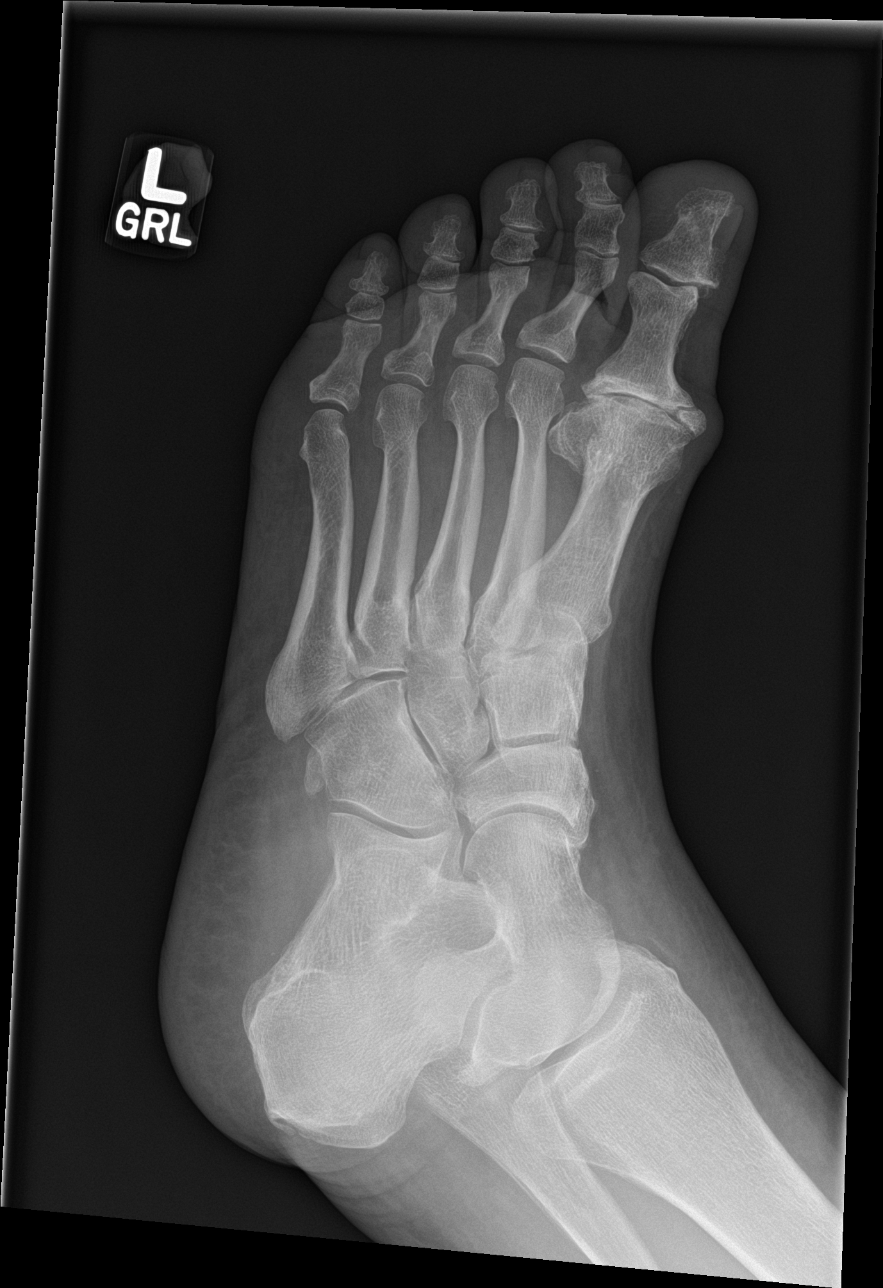

[4 of 4 positions shown; findings below may reference images not displayed]

FINDINGS: No fracture or dislocation. Prominent plantar calcaneal spur.
Osteoarthritis of the first metatarsal phalangeal joint with joint
space narrowing and osteophytes. Incidental os peroneum. Mild dorsal
soft tissue edema.
IMPRESSION: 1. No acute fracture or subluxation of the left foot.
2. Degenerative change of the first metatarsal phalangeal joint.

## 2018-03-17 DIAGNOSIS — M79643 Pain in unspecified hand: Secondary | ICD-10-CM | POA: Diagnosis not present

## 2018-03-17 DIAGNOSIS — M25569 Pain in unspecified knee: Secondary | ICD-10-CM | POA: Diagnosis not present

## 2018-03-17 DIAGNOSIS — M549 Dorsalgia, unspecified: Secondary | ICD-10-CM | POA: Diagnosis not present

## 2018-04-21 DIAGNOSIS — M1990 Unspecified osteoarthritis, unspecified site: Secondary | ICD-10-CM | POA: Diagnosis not present

## 2018-04-21 DIAGNOSIS — M79643 Pain in unspecified hand: Secondary | ICD-10-CM | POA: Diagnosis not present

## 2018-04-21 DIAGNOSIS — M25569 Pain in unspecified knee: Secondary | ICD-10-CM | POA: Diagnosis not present

## 2018-04-21 DIAGNOSIS — M549 Dorsalgia, unspecified: Secondary | ICD-10-CM | POA: Diagnosis not present

## 2018-04-25 DIAGNOSIS — M069 Rheumatoid arthritis, unspecified: Secondary | ICD-10-CM | POA: Diagnosis not present

## 2018-05-02 DIAGNOSIS — L03113 Cellulitis of right upper limb: Secondary | ICD-10-CM | POA: Diagnosis not present

## 2018-05-02 DIAGNOSIS — M069 Rheumatoid arthritis, unspecified: Secondary | ICD-10-CM | POA: Diagnosis not present

## 2018-05-06 DIAGNOSIS — M069 Rheumatoid arthritis, unspecified: Secondary | ICD-10-CM | POA: Diagnosis not present

## 2018-05-06 DIAGNOSIS — G894 Chronic pain syndrome: Secondary | ICD-10-CM | POA: Diagnosis not present

## 2018-05-06 DIAGNOSIS — M545 Low back pain: Secondary | ICD-10-CM | POA: Diagnosis not present

## 2018-05-06 DIAGNOSIS — L03119 Cellulitis of unspecified part of limb: Secondary | ICD-10-CM | POA: Diagnosis not present

## 2018-05-06 DIAGNOSIS — M961 Postlaminectomy syndrome, not elsewhere classified: Secondary | ICD-10-CM | POA: Diagnosis not present

## 2018-05-06 DIAGNOSIS — Z79891 Long term (current) use of opiate analgesic: Secondary | ICD-10-CM | POA: Diagnosis not present

## 2018-05-13 DIAGNOSIS — L03119 Cellulitis of unspecified part of limb: Secondary | ICD-10-CM | POA: Diagnosis not present

## 2018-05-13 DIAGNOSIS — M069 Rheumatoid arthritis, unspecified: Secondary | ICD-10-CM | POA: Diagnosis not present

## 2018-05-16 DIAGNOSIS — M79643 Pain in unspecified hand: Secondary | ICD-10-CM | POA: Diagnosis not present

## 2018-05-16 DIAGNOSIS — M1990 Unspecified osteoarthritis, unspecified site: Secondary | ICD-10-CM | POA: Diagnosis not present

## 2018-05-16 DIAGNOSIS — M549 Dorsalgia, unspecified: Secondary | ICD-10-CM | POA: Diagnosis not present

## 2018-05-16 DIAGNOSIS — M25569 Pain in unspecified knee: Secondary | ICD-10-CM | POA: Diagnosis not present

## 2018-06-18 DIAGNOSIS — M2550 Pain in unspecified joint: Secondary | ICD-10-CM | POA: Diagnosis not present

## 2018-06-18 DIAGNOSIS — R5383 Other fatigue: Secondary | ICD-10-CM | POA: Diagnosis not present

## 2018-06-18 DIAGNOSIS — M7989 Other specified soft tissue disorders: Secondary | ICD-10-CM | POA: Diagnosis not present

## 2018-06-18 DIAGNOSIS — M5030 Other cervical disc degeneration, unspecified cervical region: Secondary | ICD-10-CM | POA: Diagnosis not present

## 2018-06-18 DIAGNOSIS — L4059 Other psoriatic arthropathy: Secondary | ICD-10-CM | POA: Diagnosis not present

## 2018-07-01 DIAGNOSIS — M545 Low back pain: Secondary | ICD-10-CM | POA: Diagnosis not present

## 2018-07-01 DIAGNOSIS — M961 Postlaminectomy syndrome, not elsewhere classified: Secondary | ICD-10-CM | POA: Diagnosis not present

## 2018-07-01 DIAGNOSIS — Z79891 Long term (current) use of opiate analgesic: Secondary | ICD-10-CM | POA: Diagnosis not present

## 2018-07-01 DIAGNOSIS — G894 Chronic pain syndrome: Secondary | ICD-10-CM | POA: Diagnosis not present

## 2018-07-03 DIAGNOSIS — L4059 Other psoriatic arthropathy: Secondary | ICD-10-CM | POA: Diagnosis not present

## 2018-07-03 DIAGNOSIS — M5030 Other cervical disc degeneration, unspecified cervical region: Secondary | ICD-10-CM | POA: Diagnosis not present

## 2018-08-28 DIAGNOSIS — G894 Chronic pain syndrome: Secondary | ICD-10-CM | POA: Diagnosis not present

## 2018-08-28 DIAGNOSIS — M545 Low back pain: Secondary | ICD-10-CM | POA: Diagnosis not present

## 2018-08-28 DIAGNOSIS — Z79891 Long term (current) use of opiate analgesic: Secondary | ICD-10-CM | POA: Diagnosis not present

## 2018-08-28 DIAGNOSIS — M961 Postlaminectomy syndrome, not elsewhere classified: Secondary | ICD-10-CM | POA: Diagnosis not present

## 2018-10-09 DIAGNOSIS — M5030 Other cervical disc degeneration, unspecified cervical region: Secondary | ICD-10-CM | POA: Diagnosis not present

## 2018-10-09 DIAGNOSIS — L4059 Other psoriatic arthropathy: Secondary | ICD-10-CM | POA: Diagnosis not present

## 2018-10-24 DIAGNOSIS — G894 Chronic pain syndrome: Secondary | ICD-10-CM | POA: Diagnosis not present

## 2018-10-24 DIAGNOSIS — M545 Low back pain: Secondary | ICD-10-CM | POA: Diagnosis not present

## 2018-10-24 DIAGNOSIS — Z79891 Long term (current) use of opiate analgesic: Secondary | ICD-10-CM | POA: Diagnosis not present

## 2018-10-24 DIAGNOSIS — M961 Postlaminectomy syndrome, not elsewhere classified: Secondary | ICD-10-CM | POA: Diagnosis not present

## 2018-12-03 DIAGNOSIS — R42 Dizziness and giddiness: Secondary | ICD-10-CM | POA: Diagnosis not present

## 2018-12-03 DIAGNOSIS — R110 Nausea: Secondary | ICD-10-CM | POA: Diagnosis not present

## 2018-12-12 DIAGNOSIS — E7800 Pure hypercholesterolemia, unspecified: Secondary | ICD-10-CM | POA: Diagnosis not present

## 2018-12-12 DIAGNOSIS — I10 Essential (primary) hypertension: Secondary | ICD-10-CM | POA: Diagnosis not present

## 2018-12-19 DIAGNOSIS — M961 Postlaminectomy syndrome, not elsewhere classified: Secondary | ICD-10-CM | POA: Diagnosis not present

## 2018-12-19 DIAGNOSIS — M545 Low back pain: Secondary | ICD-10-CM | POA: Diagnosis not present

## 2018-12-19 DIAGNOSIS — G894 Chronic pain syndrome: Secondary | ICD-10-CM | POA: Diagnosis not present

## 2018-12-19 DIAGNOSIS — Z79891 Long term (current) use of opiate analgesic: Secondary | ICD-10-CM | POA: Diagnosis not present

## 2019-02-02 DIAGNOSIS — Z1231 Encounter for screening mammogram for malignant neoplasm of breast: Secondary | ICD-10-CM | POA: Diagnosis not present

## 2019-02-13 DIAGNOSIS — M545 Low back pain: Secondary | ICD-10-CM | POA: Diagnosis not present

## 2019-02-13 DIAGNOSIS — G894 Chronic pain syndrome: Secondary | ICD-10-CM | POA: Diagnosis not present

## 2019-02-13 DIAGNOSIS — Z79891 Long term (current) use of opiate analgesic: Secondary | ICD-10-CM | POA: Diagnosis not present

## 2019-02-13 DIAGNOSIS — M961 Postlaminectomy syndrome, not elsewhere classified: Secondary | ICD-10-CM | POA: Diagnosis not present

## 2019-03-26 DIAGNOSIS — L4059 Other psoriatic arthropathy: Secondary | ICD-10-CM | POA: Diagnosis not present

## 2019-03-26 DIAGNOSIS — M5030 Other cervical disc degeneration, unspecified cervical region: Secondary | ICD-10-CM | POA: Diagnosis not present

## 2019-03-26 DIAGNOSIS — M150 Primary generalized (osteo)arthritis: Secondary | ICD-10-CM | POA: Diagnosis not present

## 2019-03-27 DIAGNOSIS — F4323 Adjustment disorder with mixed anxiety and depressed mood: Secondary | ICD-10-CM | POA: Diagnosis not present

## 2019-04-03 DIAGNOSIS — F4323 Adjustment disorder with mixed anxiety and depressed mood: Secondary | ICD-10-CM | POA: Diagnosis not present

## 2019-04-28 DIAGNOSIS — L814 Other melanin hyperpigmentation: Secondary | ICD-10-CM | POA: Diagnosis not present

## 2019-04-28 DIAGNOSIS — L821 Other seborrheic keratosis: Secondary | ICD-10-CM | POA: Diagnosis not present

## 2019-04-28 DIAGNOSIS — D225 Melanocytic nevi of trunk: Secondary | ICD-10-CM | POA: Diagnosis not present

## 2019-04-28 DIAGNOSIS — Z85828 Personal history of other malignant neoplasm of skin: Secondary | ICD-10-CM | POA: Diagnosis not present

## 2019-04-29 DIAGNOSIS — Z0000 Encounter for general adult medical examination without abnormal findings: Secondary | ICD-10-CM | POA: Diagnosis not present

## 2019-04-29 DIAGNOSIS — R809 Proteinuria, unspecified: Secondary | ICD-10-CM | POA: Diagnosis not present

## 2019-04-29 DIAGNOSIS — J309 Allergic rhinitis, unspecified: Secondary | ICD-10-CM | POA: Diagnosis not present

## 2019-04-29 DIAGNOSIS — I10 Essential (primary) hypertension: Secondary | ICD-10-CM | POA: Diagnosis not present

## 2019-04-29 DIAGNOSIS — E7800 Pure hypercholesterolemia, unspecified: Secondary | ICD-10-CM | POA: Diagnosis not present

## 2019-05-01 DIAGNOSIS — F4323 Adjustment disorder with mixed anxiety and depressed mood: Secondary | ICD-10-CM | POA: Diagnosis not present

## 2019-05-08 DIAGNOSIS — F4323 Adjustment disorder with mixed anxiety and depressed mood: Secondary | ICD-10-CM | POA: Diagnosis not present

## 2019-05-11 ENCOUNTER — Other Ambulatory Visit: Payer: Self-pay | Admitting: Physician Assistant

## 2019-05-11 DIAGNOSIS — E2839 Other primary ovarian failure: Secondary | ICD-10-CM

## 2019-05-12 ENCOUNTER — Encounter: Payer: Self-pay | Admitting: *Deleted

## 2019-05-12 DIAGNOSIS — Z79891 Long term (current) use of opiate analgesic: Secondary | ICD-10-CM | POA: Diagnosis not present

## 2019-05-12 DIAGNOSIS — G894 Chronic pain syndrome: Secondary | ICD-10-CM | POA: Diagnosis not present

## 2019-05-12 DIAGNOSIS — M545 Low back pain: Secondary | ICD-10-CM | POA: Diagnosis not present

## 2019-05-12 DIAGNOSIS — M961 Postlaminectomy syndrome, not elsewhere classified: Secondary | ICD-10-CM | POA: Diagnosis not present

## 2019-05-13 ENCOUNTER — Other Ambulatory Visit: Payer: Self-pay

## 2019-05-13 ENCOUNTER — Telehealth: Payer: Self-pay

## 2019-05-13 DIAGNOSIS — Z8371 Family history of colonic polyps: Secondary | ICD-10-CM

## 2019-05-13 DIAGNOSIS — Z1211 Encounter for screening for malignant neoplasm of colon: Secondary | ICD-10-CM

## 2019-05-13 NOTE — Telephone Encounter (Signed)
Gastroenterology Pre-Procedure Review  Request Date: Friday 06/19/19 Requesting Physician: Dr. Allen Norris  PATIENT REVIEW QUESTIONS: The patient responded to the following health history questions as indicated:    1. Are you having any GI issues? no 2. Do you have a personal history of Polyps? no 3. Do you have a family history of Colon Cancer or Polyps? yes (dad colon polyps) 4. Diabetes Mellitus? no 5. Joint replacements in the past 12 months?no 6. Major health problems in the past 3 months?no 7. Any artificial heart valves, MVP, or defibrillator?no    MEDICATIONS & ALLERGIES:    Patient reports the following regarding taking any anticoagulation/antiplatelet therapy:   Plavix, Coumadin, Eliquis, Xarelto, Lovenox, Pradaxa, Brilinta, or Effient? no Aspirin? no  Patient confirms/reports the following medications:  No current outpatient medications on file.   No current facility-administered medications for this visit.    Patient confirms/reports the following allergies:  Allergies  Allergen Reactions  . Erythromycin   . Novocain [Procaine]   . Robaxin [Methocarbamol]     No orders of the defined types were placed in this encounter.   AUTHORIZATION INFORMATION Primary Insurance: 1D#: Group #:  Secondary Insurance: 1D#: Group #:  SCHEDULE INFORMATION: Date: Friday 06/19/19 Time: Location:[facility]

## 2019-05-15 DIAGNOSIS — F4323 Adjustment disorder with mixed anxiety and depressed mood: Secondary | ICD-10-CM | POA: Diagnosis not present

## 2019-05-22 DIAGNOSIS — F4323 Adjustment disorder with mixed anxiety and depressed mood: Secondary | ICD-10-CM | POA: Diagnosis not present

## 2019-05-29 DIAGNOSIS — F4323 Adjustment disorder with mixed anxiety and depressed mood: Secondary | ICD-10-CM | POA: Diagnosis not present

## 2019-06-02 DIAGNOSIS — H8113 Benign paroxysmal vertigo, bilateral: Secondary | ICD-10-CM | POA: Diagnosis not present

## 2019-06-11 DIAGNOSIS — H8113 Benign paroxysmal vertigo, bilateral: Secondary | ICD-10-CM | POA: Diagnosis not present

## 2019-06-15 ENCOUNTER — Telehealth: Payer: Self-pay | Admitting: Gastroenterology

## 2019-06-15 NOTE — Telephone Encounter (Signed)
Pt left vm she has a scheduled procedure for 06/19/19 with Dr. Allen Norris she needs to r/s to June still on a Friday and still with Dr. Allen Norris due to work conflict  Please call pt

## 2019-06-15 NOTE — Telephone Encounter (Signed)
Returned patients call to reschedule her colonoscopy with Dr. Allen Norris.  She is currently scheduled for 06/19/19 at [facility].  Will await cal back to see if she wants to reschedule now or later.  Thanks,  [place], [place]

## 2019-06-17 ENCOUNTER — Other Ambulatory Visit: Admission: RE | Admit: 2019-06-17 | Payer: BC Managed Care – PPO | Source: Ambulatory Visit

## 2019-06-18 ENCOUNTER — Telehealth: Payer: Self-pay

## 2019-06-18 NOTE — Telephone Encounter (Signed)
Patient was contacted by [name] in Endo regarding her COVID test not being done.  [name] informed me that patient stated she has to cancel her colonoscopy, and did not have time to reschedule.  She said she would like to schedule in June.  [name] has canceled her colonoscopy.  Thanks,  [place], [place]

## 2019-06-19 ENCOUNTER — Ambulatory Visit
Admission: RE | Admit: 2019-06-19 | Payer: BC Managed Care – PPO | Source: Home / Self Care | Admitting: Gastroenterology

## 2019-06-19 ENCOUNTER — Encounter: Admission: RE | Payer: Self-pay | Source: Home / Self Care

## 2019-06-19 SURGERY — COLONOSCOPY WITH PROPOFOL
Anesthesia: General

## 2019-06-25 ENCOUNTER — Telehealth: Payer: Self-pay | Admitting: Gastroenterology

## 2019-06-25 NOTE — Telephone Encounter (Signed)
Pt left vm to reschedule her procedure for June please call pt

## 2019-06-26 ENCOUNTER — Other Ambulatory Visit: Payer: Self-pay

## 2019-06-26 DIAGNOSIS — F4323 Adjustment disorder with mixed anxiety and depressed mood: Secondary | ICD-10-CM | POA: Diagnosis not present

## 2019-06-26 DIAGNOSIS — Z8371 Family history of colonic polyps: Secondary | ICD-10-CM

## 2019-06-26 DIAGNOSIS — Z1211 Encounter for screening for malignant neoplasm of colon: Secondary | ICD-10-CM

## 2019-06-26 NOTE — Telephone Encounter (Signed)
Returned patients call colonoscopy has been rescheduled to June 18th with Dr. Allen Norris at [facility].  Referral updated.  New instructions sent.  Thanks,  [place], [place]

## 2019-07-03 DIAGNOSIS — F4323 Adjustment disorder with mixed anxiety and depressed mood: Secondary | ICD-10-CM | POA: Diagnosis not present

## 2019-07-07 DIAGNOSIS — M47812 Spondylosis without myelopathy or radiculopathy, cervical region: Secondary | ICD-10-CM | POA: Diagnosis not present

## 2019-07-07 DIAGNOSIS — Z79891 Long term (current) use of opiate analgesic: Secondary | ICD-10-CM | POA: Diagnosis not present

## 2019-07-07 DIAGNOSIS — M961 Postlaminectomy syndrome, not elsewhere classified: Secondary | ICD-10-CM | POA: Diagnosis not present

## 2019-07-07 DIAGNOSIS — G894 Chronic pain syndrome: Secondary | ICD-10-CM | POA: Diagnosis not present

## 2019-07-10 DIAGNOSIS — F4323 Adjustment disorder with mixed anxiety and depressed mood: Secondary | ICD-10-CM | POA: Diagnosis not present

## 2019-07-24 DIAGNOSIS — F4323 Adjustment disorder with mixed anxiety and depressed mood: Secondary | ICD-10-CM | POA: Diagnosis not present

## 2019-08-10 DIAGNOSIS — Z111 Encounter for screening for respiratory tuberculosis: Secondary | ICD-10-CM | POA: Diagnosis not present

## 2019-08-10 DIAGNOSIS — Z6831 Body mass index (BMI) 31.0-31.9, adult: Secondary | ICD-10-CM | POA: Diagnosis not present

## 2019-08-10 DIAGNOSIS — M5030 Other cervical disc degeneration, unspecified cervical region: Secondary | ICD-10-CM | POA: Diagnosis not present

## 2019-08-10 DIAGNOSIS — M150 Primary generalized (osteo)arthritis: Secondary | ICD-10-CM | POA: Diagnosis not present

## 2019-08-10 DIAGNOSIS — L4059 Other psoriatic arthropathy: Secondary | ICD-10-CM | POA: Diagnosis not present

## 2019-08-10 DIAGNOSIS — F4323 Adjustment disorder with mixed anxiety and depressed mood: Secondary | ICD-10-CM | POA: Diagnosis not present

## 2019-08-21 ENCOUNTER — Telehealth: Payer: Self-pay

## 2019-08-21 ENCOUNTER — Other Ambulatory Visit: Payer: Self-pay

## 2019-08-21 NOTE — Telephone Encounter (Signed)
Patient was contacted to reschedule her colonoscopy from 06/18 with Dr. Allen Norris at [facility] to another date due to Dr. Allen Norris is in [place] on Fridays.  She has been rescheduled to Tuesday 09/29/19.  Thanks,  [place], [place]

## 2019-08-24 ENCOUNTER — Other Ambulatory Visit: Payer: BC Managed Care – PPO

## 2019-08-24 DIAGNOSIS — L4059 Other psoriatic arthropathy: Secondary | ICD-10-CM | POA: Diagnosis not present

## 2019-09-02 DIAGNOSIS — M47812 Spondylosis without myelopathy or radiculopathy, cervical region: Secondary | ICD-10-CM | POA: Diagnosis not present

## 2019-09-02 DIAGNOSIS — M961 Postlaminectomy syndrome, not elsewhere classified: Secondary | ICD-10-CM | POA: Diagnosis not present

## 2019-09-02 DIAGNOSIS — G894 Chronic pain syndrome: Secondary | ICD-10-CM | POA: Diagnosis not present

## 2019-09-02 DIAGNOSIS — Z79891 Long term (current) use of opiate analgesic: Secondary | ICD-10-CM | POA: Diagnosis not present

## 2019-09-07 DIAGNOSIS — L4059 Other psoriatic arthropathy: Secondary | ICD-10-CM | POA: Diagnosis not present

## 2019-09-23 DIAGNOSIS — F4323 Adjustment disorder with mixed anxiety and depressed mood: Secondary | ICD-10-CM | POA: Diagnosis not present

## 2019-09-25 ENCOUNTER — Other Ambulatory Visit: Admission: RE | Admit: 2019-09-25 | Payer: BC Managed Care – PPO | Source: Ambulatory Visit

## 2019-09-29 ENCOUNTER — Telehealth: Payer: Self-pay

## 2019-09-29 NOTE — Telephone Encounter (Signed)
Patient LVM to reschedule 09/29/19 Colonoscopy to August due to family emergency.  Colonoscopy has been rescheduled to 12/01/19.  New instructions completed and mailed to patient.  [name] in Endo notified.  Referral has been updated.  Thanks,  [place], [place]

## 2019-10-05 DIAGNOSIS — L4059 Other psoriatic arthropathy: Secondary | ICD-10-CM | POA: Diagnosis not present

## 2019-10-07 DIAGNOSIS — F4323 Adjustment disorder with mixed anxiety and depressed mood: Secondary | ICD-10-CM | POA: Diagnosis not present

## 2019-10-21 DIAGNOSIS — F4323 Adjustment disorder with mixed anxiety and depressed mood: Secondary | ICD-10-CM | POA: Diagnosis not present

## 2019-10-28 DIAGNOSIS — M961 Postlaminectomy syndrome, not elsewhere classified: Secondary | ICD-10-CM | POA: Diagnosis not present

## 2019-10-28 DIAGNOSIS — G894 Chronic pain syndrome: Secondary | ICD-10-CM | POA: Diagnosis not present

## 2019-10-28 DIAGNOSIS — M47812 Spondylosis without myelopathy or radiculopathy, cervical region: Secondary | ICD-10-CM | POA: Diagnosis not present

## 2019-10-28 DIAGNOSIS — Z79891 Long term (current) use of opiate analgesic: Secondary | ICD-10-CM | POA: Diagnosis not present

## 2019-11-02 DIAGNOSIS — J018 Other acute sinusitis: Secondary | ICD-10-CM | POA: Diagnosis not present

## 2019-11-09 DIAGNOSIS — F4323 Adjustment disorder with mixed anxiety and depressed mood: Secondary | ICD-10-CM | POA: Diagnosis not present

## 2019-11-10 DIAGNOSIS — M5030 Other cervical disc degeneration, unspecified cervical region: Secondary | ICD-10-CM | POA: Diagnosis not present

## 2019-11-10 DIAGNOSIS — L4059 Other psoriatic arthropathy: Secondary | ICD-10-CM | POA: Diagnosis not present

## 2019-11-10 DIAGNOSIS — M150 Primary generalized (osteo)arthritis: Secondary | ICD-10-CM | POA: Diagnosis not present

## 2019-11-10 DIAGNOSIS — M2550 Pain in unspecified joint: Secondary | ICD-10-CM | POA: Diagnosis not present

## 2019-11-27 ENCOUNTER — Other Ambulatory Visit: Payer: Self-pay

## 2019-11-27 ENCOUNTER — Other Ambulatory Visit
Admission: RE | Admit: 2019-11-27 | Discharge: 2019-11-27 | Disposition: A | Discharge status: Home / Self Care | Payer: BC Managed Care – PPO | Source: Ambulatory Visit | Attending: Gastroenterology | Admitting: Gastroenterology

## 2019-11-27 DIAGNOSIS — Z20822 Contact with and (suspected) exposure to covid-19: Secondary | ICD-10-CM | POA: Insufficient documentation

## 2019-11-27 DIAGNOSIS — Z01812 Encounter for preprocedural laboratory examination: Secondary | ICD-10-CM | POA: Insufficient documentation

## 2019-11-28 LAB — SARS CORONAVIRUS 2 (TAT 6-24 HRS): SARS Coronavirus 2: NEGATIVE

## 2019-12-01 ENCOUNTER — Ambulatory Visit: Payer: BC Managed Care – PPO | Admitting: Anesthesiology

## 2019-12-01 ENCOUNTER — Ambulatory Visit
Admission: RE | Admit: 2019-12-01 | Discharge: 2019-12-01 | Disposition: A | Discharge status: Home / Self Care | Payer: BC Managed Care – PPO | Attending: Gastroenterology | Admitting: Gastroenterology

## 2019-12-01 ENCOUNTER — Other Ambulatory Visit: Payer: Self-pay

## 2019-12-01 ENCOUNTER — Encounter: Payer: Self-pay | Admitting: Gastroenterology

## 2019-12-01 ENCOUNTER — Encounter
Admission: RE | Disposition: A | Discharge status: Home / Self Care | Payer: Self-pay | Source: Home / Self Care | Attending: Gastroenterology

## 2019-12-01 DIAGNOSIS — D120 Benign neoplasm of cecum: Secondary | ICD-10-CM | POA: Diagnosis not present

## 2019-12-01 DIAGNOSIS — Z8371 Family history of colonic polyps: Secondary | ICD-10-CM

## 2019-12-01 DIAGNOSIS — Z881 Allergy status to other antibiotic agents status: Secondary | ICD-10-CM | POA: Diagnosis not present

## 2019-12-01 DIAGNOSIS — Z79899 Other long term (current) drug therapy: Secondary | ICD-10-CM | POA: Diagnosis not present

## 2019-12-01 DIAGNOSIS — M1990 Unspecified osteoarthritis, unspecified site: Secondary | ICD-10-CM | POA: Insufficient documentation

## 2019-12-01 DIAGNOSIS — I10 Essential (primary) hypertension: Secondary | ICD-10-CM | POA: Insufficient documentation

## 2019-12-01 DIAGNOSIS — Z888 Allergy status to other drugs, medicaments and biological substances status: Secondary | ICD-10-CM | POA: Diagnosis not present

## 2019-12-01 DIAGNOSIS — Z1211 Encounter for screening for malignant neoplasm of colon: Secondary | ICD-10-CM

## 2019-12-01 DIAGNOSIS — K635 Polyp of colon: Secondary | ICD-10-CM | POA: Diagnosis not present

## 2019-12-01 DIAGNOSIS — Z800 Family history of malignant neoplasm of digestive organs: Secondary | ICD-10-CM | POA: Diagnosis not present

## 2019-12-01 DIAGNOSIS — K640 First degree hemorrhoids: Secondary | ICD-10-CM | POA: Diagnosis not present

## 2019-12-01 HISTORY — PX: COLONOSCOPY WITH PROPOFOL: SHX5780

## 2019-12-01 HISTORY — DX: Essential (primary) hypertension: I10

## 2019-12-01 HISTORY — DX: Nausea with vomiting, unspecified: R11.2

## 2019-12-01 HISTORY — DX: Other complications of anesthesia, initial encounter: T88.59XA

## 2019-12-01 HISTORY — DX: Other chronic pain: G89.29

## 2019-12-01 HISTORY — DX: Unspecified osteoarthritis, unspecified site: M19.90

## 2019-12-01 HISTORY — DX: Other specified postprocedural states: Z98.890

## 2019-12-01 SURGERY — COLONOSCOPY WITH PROPOFOL
Anesthesia: General

## 2019-12-01 MED ORDER — PROPOFOL 10 MG/ML IV BOLUS
INTRAVENOUS | Status: DC | PRN
  Administered 2019-12-01: 50 mg via INTRAVENOUS
  Administered 2019-12-01: 10 mg via INTRAVENOUS
  Administered 2019-12-01: 20 mg via INTRAVENOUS

## 2019-12-01 MED ORDER — SODIUM CHLORIDE 0.9 % IV SOLN: INTRAVENOUS | Status: DC

## 2019-12-01 MED ORDER — PROPOFOL 500 MG/50ML IV EMUL
INTRAVENOUS | Status: DC | PRN
  Administered 2019-12-01: 150 ug/kg/min via INTRAVENOUS

## 2019-12-01 MED ORDER — PROPOFOL 500 MG/50ML IV EMUL
INTRAVENOUS | Status: AC
  Filled 2019-12-01: qty 50

## 2019-12-01 NOTE — Transfer of Care (Signed)
Immediate Anesthesia Transfer of Care Note  Patient: [name]  Procedure(s) Performed: COLONOSCOPY WITH PROPOFOL (N/A )  Patient Location: PACU  Anesthesia Type:General  Level of Consciousness: drowsy  Airway & Oxygen Therapy: Patient Spontanous Breathing and Patient connected to nasal cannula oxygen  Post-op Assessment: Report given to RN and Post -op Vital signs reviewed and stable  Post vital signs: Reviewed and stable  Last Vitals:  Vitals Value Taken Time  BP 96/54 12/01/19 0950  Temp    Pulse 54 12/01/19 0950  Resp 11 12/01/19 0950  SpO2 100 % 12/01/19 0950  Vitals shown include unvalidated device data.  Last Pain:  Vitals:   12/01/19 0832  TempSrc: Oral  PainSc: 5          Complications: No complications documented.

## 2019-12-01 NOTE — Op Note (Signed)
[facility] Gastroenterology Patient Name: [name] Procedure Date: 12/01/2019 9:22 AM MRN: [id number] Account #: [id number] Date of Birth: February 08, 1956 Admit Type: Outpatient Age: 64 Room: [facility] ENDO ROOM 4 Gender: Female Note Status: Finalized Procedure:             Colonoscopy Indications:           Screening for colorectal malignant neoplasm Providers:             Lucilla Lame MD, MD Referring MD:          Lennie Odor (Referring MD) Medicines:             Propofol per Anesthesia Complications:         No immediate complications. Procedure:             Pre-Anesthesia Assessment:                        - Prior to the procedure, a History and Physical was                         performed, and patient medications and allergies were                         reviewed. The patient's tolerance of previous                         anesthesia was also reviewed. The risks and benefits                         of the procedure and the sedation options and risks                         were discussed with the patient. All questions were                         answered, and informed consent was obtained. Prior                         Anticoagulants: The patient has taken no previous                         anticoagulant or antiplatelet agents. ASA Grade                         Assessment: II - A patient with mild systemic disease.                         After reviewing the risks and benefits, the patient                         was deemed in satisfactory condition to undergo the                         procedure.                        After obtaining informed consent, the colonoscope was  passed under direct vision. Throughout the procedure,                         the patient's blood pressure, pulse, and oxygen                         saturations were monitored continuously. The                         Colonoscope was introduced through the anus  and                         advanced to the the cecum, identified by appendiceal                         orifice and ileocecal valve. The colonoscopy was                         performed without difficulty. The patient tolerated                         the procedure well. The quality of the bowel                         preparation was excellent. Findings:      The perianal and digital rectal examinations were normal.      A 6 mm polyp was found in the cecum. The polyp was sessile. The polyp       was removed with a cold snare. Resection and retrieval were complete.      Non-bleeding internal hemorrhoids were found during retroflexion. The       hemorrhoids were Grade I (internal hemorrhoids that do not prolapse). Impression:            - One 6 mm polyp in the cecum, removed with a cold                         snare. Resected and retrieved.                        - Non-bleeding internal hemorrhoids. Recommendation:        - Discharge patient to home.                        - Resume previous diet.                        - Continue present medications.                        - Await pathology results.                        - Repeat colonoscopy in 7 years if polyp adenoma and                         10 years if hyperplastic Procedure Code(s):     --- Professional ---                        [phone], Colonoscopy, flexible;  with removal of                         tumor(s), polyp(s), or other lesion(s) by snare                         technique Diagnosis Code(s):     --- Professional ---                        Z12.11, Encounter for screening for malignant neoplasm                         of colon                        K63.5, Polyp of colon CPT copyright 2019 American Medical Association. All rights reserved. The codes documented in this report are preliminary and upon coder review may  be revised to meet current compliance requirements. Lucilla Lame MD, MD 12/01/2019 9:48:38 AM This report  has been signed electronically. Number of Addenda: 0 Note Initiated On: 12/01/2019 9:22 AM Scope Withdrawal Time: 0 hours 9 minutes 38 seconds  Total Procedure Duration: 0 hours 14 minutes 10 seconds  Estimated Blood Loss:  Estimated blood loss: none.      [facility]

## 2019-12-01 NOTE — Anesthesia Postprocedure Evaluation (Signed)
Anesthesia Post Note  Patient: [name]  Procedure(s) Performed: COLONOSCOPY WITH PROPOFOL (N/A )  Patient location during evaluation: Endoscopy Anesthesia Type: General Level of consciousness: awake and alert Pain management: pain level controlled Vital Signs Assessment: post-procedure vital signs reviewed and stable Respiratory status: spontaneous breathing and respiratory function stable Cardiovascular status: stable Anesthetic complications: no   No complications documented.   Last Vitals:  Vitals:   12/01/19 0832 12/01/19 0950  BP: (!) 168/75 (!) 96/54  Pulse: (!) 50   Resp: 16   Temp: 36.9 C   SpO2: 100%     Last Pain:  Vitals:   12/01/19 0832  TempSrc: Oral  PainSc: 5                  [name]

## 2019-12-01 NOTE — H&P (Signed)
Lucilla Lame, MD [address] [place], [place] [address] Phone: [phone] Fax : [phone]  Primary Care Physician:  Lennie Odor, [place] Primary Gastroenterologist:  Dr. Allen Norris  Pre-Procedure History & Physical: HPI:  [name] is a 63 y.o. female is here for a screening colonoscopy.   Past Medical History:  Diagnosis Date  . Arthritis   . Chronic pain    neck and lower back  . Complication of anesthesia   . Hypertension   . PONV (postoperative nausea and vomiting)     Past Surgical History:  Procedure Laterality Date  . ABDOMINAL HYSTERECTOMY    . BACK SURGERY      Prior to Admission medications   Medication Sig Start Date End Date Taking? Authorizing Provider  ATENOLOL PO Take 1 tablet by mouth daily.   Yes [provider]  HYDROcodone-acetaminophen (NORCO/VICODIN) 5-325 MG tablet Take 1-2 tablets by mouth every 6 (six) hours as needed for moderate pain.   Yes [provider]    Allergies as of 06/26/2019 - Review Complete 12/09/2016  Allergen Reaction Noted  . Erythromycin  12/09/2016  . Novocain [procaine]  12/09/2016  . Robaxin [methocarbamol]  12/09/2016    History reviewed. No pertinent family history.  Social History   Socioeconomic History  . Marital status: Married    Spouse name: Not on file  . Number of children: Not on file  . Years of education: Not on file  . Highest education level: Not on file  Occupational History  . Not on file  Tobacco Use  . Smoking status: Not on file  Substance and Sexual Activity  . Alcohol use: Not Currently  . Drug use: Not Currently  . Sexual activity: Not on file  Other Topics Concern  . Not on file  Social History Narrative  . Not on file   Social Determinants of Health   Financial Resource Strain:   . Difficulty of Paying Living Expenses:   Food Insecurity:   . Worried About Charity fundraiser in the Last Year:   . Arboriculturist in the Last Year:     Transportation Needs:   . Film/video editor (Medical):   Marland Kitchen Lack of Transportation (Non-Medical):   Physical Activity:   . Days of Exercise per Week:   . Minutes of Exercise per Session:   Stress:   . Feeling of Stress :   Social Connections:   . Frequency of Communication with Friends and Family:   . Frequency of Social Gatherings with Friends and Family:   . Attends Religious Services:   . Active Member of Clubs or Organizations:   . Attends Archivist Meetings:   Marland Kitchen Marital Status:   Intimate Partner Violence:   . Fear of Current or Ex-Partner:   . Emotionally Abused:   Marland Kitchen Physically Abused:   . Sexually Abused:     Review of Systems: See HPI, otherwise negative ROS  Physical Exam: BP (!) 168/75   Pulse (!) 50   Temp 98.4 F (36.9 C) (Oral)   Resp 16   Ht 5\' 4"  (1.626 m)   Wt 80.7 kg   SpO2 100%   BMI 30.55 kg/m  General:   Alert,  pleasant and cooperative in NAD Head:  Normocephalic and atraumatic. Neck:  Supple; no masses or thyromegaly. Lungs:  Clear throughout to auscultation.    Heart:  Regular rate and rhythm. Abdomen:  Soft, nontender and nondistended. Normal bowel sounds,  without guarding, and without rebound.   Neurologic:  Alert and  oriented x4;  grossly normal neurologically.  Impression/Plan: [name] is now here to undergo a screening colonoscopy.  Risks, benefits, and alternatives regarding colonoscopy have been reviewed with the patient.  Questions have been answered.  All parties agreeable.

## 2019-12-01 NOTE — Anesthesia Preprocedure Evaluation (Signed)
Anesthesia Evaluation  Patient identified by MRN, date of birth, ID band Patient awake    Reviewed: Allergy & Precautions, NPO status , Patient's Chart, lab work & pertinent test results, reviewed documented beta blocker date and time   History of Anesthesia Complications (+) PONV and history of anesthetic complications  Airway Mallampati: II       Dental   Pulmonary neg sleep apnea, neg COPD, Not current smoker,           Cardiovascular hypertension, Pt. on medications and Pt. on home beta blockers (-) Past MI and (-) CHF (-) dysrhythmias (-) Valvular Problems/Murmurs     Neuro/Psych neg Seizures    GI/Hepatic Neg liver ROS, neg GERD  ,  Endo/Other  neg diabetes  Renal/GU negative Renal ROS     Musculoskeletal   Abdominal   Peds  Hematology   Anesthesia Other Findings   Reproductive/Obstetrics                             Anesthesia Physical Anesthesia Plan  ASA: III  Anesthesia Plan: General   Post-op Pain Management:    Induction: Intravenous  PONV Risk Score and Plan: 4 or greater and Propofol infusion, TIVA and Treatment may vary due to age or medical condition  Airway Management Planned: Nasal Cannula  Additional Equipment:   Intra-op Plan:   Post-operative Plan:   Informed Consent: I have reviewed the patients History and Physical, chart, labs and discussed the procedure including the risks, benefits and alternatives for the proposed anesthesia with the patient or authorized representative who has indicated his/her understanding and acceptance.       Plan Discussed with:   Anesthesia Plan Comments:         Anesthesia Quick Evaluation

## 2019-12-02 ENCOUNTER — Encounter: Payer: Self-pay | Admitting: Gastroenterology

## 2019-12-02 DIAGNOSIS — F4323 Adjustment disorder with mixed anxiety and depressed mood: Secondary | ICD-10-CM | POA: Diagnosis not present

## 2019-12-02 LAB — SURGICAL PATHOLOGY

## 2019-12-03 ENCOUNTER — Encounter: Payer: Self-pay | Admitting: Gastroenterology

## 2019-12-14 DIAGNOSIS — I10 Essential (primary) hypertension: Secondary | ICD-10-CM | POA: Diagnosis not present

## 2019-12-14 DIAGNOSIS — E7800 Pure hypercholesterolemia, unspecified: Secondary | ICD-10-CM | POA: Diagnosis not present

## 2019-12-14 DIAGNOSIS — R682 Dry mouth, unspecified: Secondary | ICD-10-CM | POA: Diagnosis not present

## 2019-12-14 DIAGNOSIS — J3489 Other specified disorders of nose and nasal sinuses: Secondary | ICD-10-CM | POA: Diagnosis not present

## 2019-12-16 DIAGNOSIS — F4323 Adjustment disorder with mixed anxiety and depressed mood: Secondary | ICD-10-CM | POA: Diagnosis not present

## 2019-12-23 DIAGNOSIS — M961 Postlaminectomy syndrome, not elsewhere classified: Secondary | ICD-10-CM | POA: Diagnosis not present

## 2019-12-23 DIAGNOSIS — Z79891 Long term (current) use of opiate analgesic: Secondary | ICD-10-CM | POA: Diagnosis not present

## 2019-12-23 DIAGNOSIS — M47812 Spondylosis without myelopathy or radiculopathy, cervical region: Secondary | ICD-10-CM | POA: Diagnosis not present

## 2019-12-23 DIAGNOSIS — G894 Chronic pain syndrome: Secondary | ICD-10-CM | POA: Diagnosis not present

## 2019-12-24 DIAGNOSIS — G894 Chronic pain syndrome: Secondary | ICD-10-CM | POA: Diagnosis not present

## 2019-12-24 DIAGNOSIS — Z79891 Long term (current) use of opiate analgesic: Secondary | ICD-10-CM | POA: Diagnosis not present

## 2019-12-30 DIAGNOSIS — F4323 Adjustment disorder with mixed anxiety and depressed mood: Secondary | ICD-10-CM | POA: Diagnosis not present

## 2020-01-13 DIAGNOSIS — T148XXA Other injury of unspecified body region, initial encounter: Secondary | ICD-10-CM | POA: Diagnosis not present

## 2020-01-13 DIAGNOSIS — L0390 Cellulitis, unspecified: Secondary | ICD-10-CM | POA: Diagnosis not present

## 2020-01-27 DIAGNOSIS — F4323 Adjustment disorder with mixed anxiety and depressed mood: Secondary | ICD-10-CM | POA: Diagnosis not present

## 2020-02-10 DIAGNOSIS — F4323 Adjustment disorder with mixed anxiety and depressed mood: Secondary | ICD-10-CM | POA: Diagnosis not present

## 2020-02-10 DIAGNOSIS — L4059 Other psoriatic arthropathy: Secondary | ICD-10-CM | POA: Diagnosis not present

## 2020-02-10 DIAGNOSIS — M150 Primary generalized (osteo)arthritis: Secondary | ICD-10-CM | POA: Diagnosis not present

## 2020-02-10 DIAGNOSIS — M25549 Pain in joints of unspecified hand: Secondary | ICD-10-CM | POA: Diagnosis not present

## 2020-02-10 DIAGNOSIS — M5030 Other cervical disc degeneration, unspecified cervical region: Secondary | ICD-10-CM | POA: Diagnosis not present

## 2020-02-24 DIAGNOSIS — F4323 Adjustment disorder with mixed anxiety and depressed mood: Secondary | ICD-10-CM | POA: Diagnosis not present

## 2020-03-04 DIAGNOSIS — M961 Postlaminectomy syndrome, not elsewhere classified: Secondary | ICD-10-CM | POA: Diagnosis not present

## 2020-03-04 DIAGNOSIS — M47812 Spondylosis without myelopathy or radiculopathy, cervical region: Secondary | ICD-10-CM | POA: Diagnosis not present

## 2020-03-04 DIAGNOSIS — Z79891 Long term (current) use of opiate analgesic: Secondary | ICD-10-CM | POA: Diagnosis not present

## 2020-03-04 DIAGNOSIS — G894 Chronic pain syndrome: Secondary | ICD-10-CM | POA: Diagnosis not present

## 2020-03-24 DIAGNOSIS — F4323 Adjustment disorder with mixed anxiety and depressed mood: Secondary | ICD-10-CM | POA: Diagnosis not present

## 2020-04-20 DIAGNOSIS — F4323 Adjustment disorder with mixed anxiety and depressed mood: Secondary | ICD-10-CM | POA: Diagnosis not present

## 2020-04-29 DIAGNOSIS — M47812 Spondylosis without myelopathy or radiculopathy, cervical region: Secondary | ICD-10-CM | POA: Diagnosis not present

## 2020-04-29 DIAGNOSIS — G894 Chronic pain syndrome: Secondary | ICD-10-CM | POA: Diagnosis not present

## 2020-04-29 DIAGNOSIS — M961 Postlaminectomy syndrome, not elsewhere classified: Secondary | ICD-10-CM | POA: Diagnosis not present

## 2020-04-29 DIAGNOSIS — Z79891 Long term (current) use of opiate analgesic: Secondary | ICD-10-CM | POA: Diagnosis not present

## 2020-05-04 DIAGNOSIS — F4323 Adjustment disorder with mixed anxiety and depressed mood: Secondary | ICD-10-CM | POA: Diagnosis not present

## 2020-05-19 DIAGNOSIS — F4323 Adjustment disorder with mixed anxiety and depressed mood: Secondary | ICD-10-CM | POA: Diagnosis not present

## 2020-05-31 DIAGNOSIS — F4323 Adjustment disorder with mixed anxiety and depressed mood: Secondary | ICD-10-CM | POA: Diagnosis not present

## 2020-06-09 DIAGNOSIS — F4323 Adjustment disorder with mixed anxiety and depressed mood: Secondary | ICD-10-CM | POA: Diagnosis not present

## 2022-04-14 ENCOUNTER — Ambulatory Visit
Admission: EM | Admit: 2022-04-14 | Discharge: 2022-04-14 | Disposition: A | Discharge status: Home / Self Care | Payer: Medicare Other

## 2022-04-14 DIAGNOSIS — B349 Viral infection, unspecified: Secondary | ICD-10-CM

## 2022-04-14 HISTORY — DX: Arthropathic psoriasis, unspecified: L40.50

## 2022-04-14 MED ORDER — BENZONATATE 100 MG PO CAPS: 100.0000 mg | ORAL_CAPSULE | Freq: Three times a day (TID) | ORAL | 0 refills | Status: AC | PRN

## 2022-04-14 MED ORDER — PREDNISONE 10 MG (21) PO TBPK: ORAL_TABLET | Freq: Every day | ORAL | 0 refills | Status: AC

## 2022-04-14 NOTE — ED Provider Notes (Signed)
[name]    CSN: [id number] Arrival date & time: 04/14/22  1210      History   Chief Complaint Chief Complaint  Patient presents with   Cough    Body aches, headache, fatigue, chills, etc. cough [place] the worst symptom. I hurts when I cough. - Entered by patient    HPI [name] is a 66 y.o. female.  Patient presents with chills, fatigue, body aches, headache, congestion, cough x 5 days.  She had fever at the onset of her symptoms but none in the past 3 days.  She denies ear pain, sore throat, shortness of breath, chest pain, diarrhea, or other symptoms.  Treatment at home with Tylenol and Mucinex.  Patient was seen at [facility] internal medicine on 01/25/2022; diagnosed with lower respiratory infection, cough, fatigue; treated with Levaquin, Tessalon Perles, hydrocodone cough syrup.  The history is provided by the patient and medical records.    Past Medical History:  Diagnosis Date   Arthritis    Chronic pain    neck and lower back   Complication of anesthesia    Hypertension    PONV (postoperative nausea and vomiting)    Psoriatic arthritis [facility])     Patient Active Problem List   Diagnosis Date Noted   Encounter for screening colonoscopy    Benign neoplasm of cecum     Past Surgical History:  Procedure Laterality Date   ABDOMINAL HYSTERECTOMY     BACK SURGERY     COLONOSCOPY WITH PROPOFOL N/A 12/01/2019   Procedure: COLONOSCOPY WITH PROPOFOL;  Surgeon: Lucilla Lame, MD;  Location: [facility] ENDOSCOPY;  Service: Endoscopy;  Laterality: N/A;    OB History   No obstetric history on file.      Home Medications    Prior to Admission medications   Medication Sig Start Date End Date Taking? Authorizing Provider  benzonatate (TESSALON) 100 MG capsule Take 1 capsule (100 mg total) by mouth 3 (three) times daily as needed for cough. 04/14/22  Yes Sharion Balloon, NP  cyclobenzaprine (FLEXERIL) 10 MG tablet Take by mouth. 06/10/13  Yes [provider]  HYDROcodone-acetaminophen (NORCO) 10-325 MG tablet TAKE 1 TABLET BY MOUTH 5 TIMES DAILY AS NEEDED FOR PAIN. DNFU 01/11/22 07/27/13  Yes [provider]  hydrOXYzine (VISTARIL) 25 MG capsule TAKE 1 CAPSULE BY MOUTH UP TO TWICE DAILY FOR FLIGHT ANXIETY (MAY CAUSE DROWSINESS) 08/04/21  Yes [provider]  lisinopril (ZESTRIL) 10 MG tablet TAKE 1 TABLET BY MOUTH EVERY DAY FOR 90 DAYS 01/05/22  Yes [provider]  montelukast (SINGULAIR) 10 MG tablet TAKE 1 TABLET BY MOUTH EVERY DAY AS NEEDED FOR ALLERGIES OR ASTHMA 01/15/22  Yes [provider]  morphine (MS CONTIN) 60 MG 12 hr tablet TAKE 1 TABLET BY MOUTH EVERY 8 HOURS. DNFU 01/20/22 02/11/16  Yes [provider]  predniSONE (STERAPRED UNI-PAK 21 TAB) 10 MG (21) TBPK tablet Take by mouth daily. As directed 04/14/22  Yes Sharion Balloon, NP  promethazine (PHENERGAN) 25 MG tablet TAKE 1 TO 2 TABLETS BY MOUTH AS NEEDED 01/28/16  Yes [provider]  atenolol (TENORMIN) 100 MG tablet Take 100 mg by mouth daily.    [provider]  ATENOLOL PO Take 1 tablet by mouth daily.    [provider]  HYDROcodone-acetaminophen (NORCO/VICODIN) 5-325 MG tablet Take 1-2 tablets by mouth every 6 (six) hours as needed for moderate pain.    [provider]    Family History  History reviewed. No pertinent family history.  Social History Social History   Tobacco Use   Smoking status: Never  Substance Use Topics   Alcohol use: Not Currently   Drug use: Not Currently     Allergies   Erythromycin, Novocain [procaine], and Robaxin [methocarbamol]   Review of Systems Review of Systems  Constitutional:  Positive for chills, fatigue and fever.  HENT:  Positive for congestion. Negative for ear pain and sore throat.   Respiratory:  Positive for cough. Negative for shortness of breath.   Cardiovascular:  Negative for chest pain and palpitations.  Gastrointestinal:  Negative for  diarrhea and vomiting.  Skin:  Negative for color change and rash.  Neurological:  Positive for headaches.  All other systems reviewed and are negative.    Physical Exam Triage Vital Signs ED Triage Vitals  Enc Vitals Group     BP 04/14/22 1349 (!) 147/79     Pulse Rate 04/14/22 1349 62     Resp 04/14/22 1349 18     Temp 04/14/22 1349 98 F (36.7 C)     Temp Source 04/14/22 1349 Oral     SpO2 04/14/22 1349 98 %     Weight 04/14/22 1345 190 lb (86.2 kg)     Height 04/14/22 1345 5' 1.5" (1.562 m)     Head Circumference --      Peak Flow --      Pain Score 04/14/22 1342 4     Pain Loc --      Pain Edu? --      Excl. in [place]? --    No data found.  Updated Vital Signs BP (!) 147/79   Pulse 62   Temp 98 F (36.7 C) (Oral)   Resp 18   Ht 5' 1.5" (1.562 m)   Wt 190 lb (86.2 kg)   SpO2 98%   BMI 35.32 kg/m   Visual Acuity Right Eye Distance:   Left Eye Distance:   Bilateral Distance:    Right Eye Near:   Left Eye Near:    Bilateral Near:     Physical Exam Vitals and nursing note reviewed.  Constitutional:      General: She is not in acute distress.    Appearance: Normal appearance. She is well-developed. She is ill-appearing.  HENT:     Right Ear: Tympanic membrane normal.     Left Ear: Tympanic membrane normal.     Nose: Nose normal.     Mouth/Throat:     Mouth: Mucous membranes are moist.     Pharynx: Oropharynx is clear.  Cardiovascular:     Rate and Rhythm: Normal rate and regular rhythm.     Heart sounds: Normal heart sounds.  Pulmonary:     Effort: Pulmonary effort is normal. No respiratory distress.     Breath sounds: Normal breath sounds.  Musculoskeletal:     Cervical back: Neck supple.  Skin:    General: Skin is warm and dry.  Neurological:     Mental Status: She is alert.  Psychiatric:        Mood and Affect: Mood normal.        Behavior: Behavior normal.      UC Treatments / Results  Labs (all labs ordered are listed, but only  abnormal results are displayed) Labs Reviewed - No data to display  EKG   Radiology No results found.  Procedures Procedures (including critical care time)  Medications Ordered in UC Medications - No data  to display  Initial Impression / Assessment and Plan / UC Course  I have reviewed the triage vital signs and the nursing notes.  Pertinent labs & imaging results that were available during my care of the patient were reviewed by me and considered in my medical decision making (see chart for details).    Viral illness.  VSS. Lungs are clear, O2 sat 98% on room air.  Patient declines COVID test.  She is on day 5 of symptoms.  Treating with prednisone and Tessalon Perles.  Discussed symptomatic treatment including Tylenol, rest, hydration.  Instructed patient to follow up with her PCP if symptoms are not improving.  She agrees to plan of care.   Final Clinical Impressions(s) / UC Diagnoses   Final diagnoses:  Viral illness     Discharge Instructions      Take the prednisone and Tessalon Perles as directed.  Follow up with your primary care provider if your symptoms are not improving.        ED Prescriptions     Medication Sig Dispense Auth. Provider   benzonatate (TESSALON) 100 MG capsule Take 1 capsule (100 mg total) by mouth 3 (three) times daily as needed for cough. 21 capsule Sharion Balloon, NP   predniSONE (STERAPRED UNI-PAK 21 TAB) 10 MG (21) TBPK tablet Take by mouth daily. As directed 21 tablet Sharion Balloon, NP      PDMP not reviewed this encounter.   Sharion Balloon, NP 04/14/22 1416

## 2022-04-14 NOTE — ED Triage Notes (Signed)
Patient to Urgent Care with complaints of cough, body aches, headache, fatigue and chills x4 days.   Reports bad cough at night that prevents her from sleeping. Cough is dry, states it sounds wet but is unable to produce anything.  Reports intermittent fevers- max 102.   Has been taking mucinex/ tylenol.

## 2022-04-14 NOTE — Discharge Instructions (Addendum)
Take the prednisone and Tessalon Perles as directed.  Follow up with your primary care provider if your symptoms are not improving.

## 2022-10-31 ENCOUNTER — Ambulatory Visit: Payer: Self-pay | Admitting: Orthopedic Surgery

## 2024-05-29 ENCOUNTER — Ambulatory Visit: Payer: Self-pay | Admitting: Podiatry
